# Patient Record
Sex: Female | Born: 1980 | Hispanic: No | Smoking: Current every day smoker
Health system: Southern US, Community
[De-identification: ages and names within clinical notes are randomized; demographics above are authoritative.]

## PROBLEM LIST (undated history)

## (undated) DIAGNOSIS — N200 Calculus of kidney: Secondary | ICD-10-CM

## (undated) HISTORY — PX: LITHOTRIPSY: SUR834

---

## 2007-08-17 ENCOUNTER — Other Ambulatory Visit: Admission: RE | Admit: 2007-08-17 | Discharge: 2007-08-17 | Payer: Self-pay | Admitting: Obstetrics and Gynecology

## 2012-03-30 ENCOUNTER — Emergency Department (HOSPITAL_COMMUNITY)
Admission: EM | Admit: 2012-03-30 | Discharge: 2012-03-30 | Disposition: A | Discharge status: Home / Self Care | Payer: Self-pay | Attending: Emergency Medicine | Admitting: Emergency Medicine

## 2012-03-30 ENCOUNTER — Emergency Department (HOSPITAL_COMMUNITY): Payer: Self-pay

## 2012-03-30 ENCOUNTER — Encounter (HOSPITAL_COMMUNITY): Payer: Self-pay | Admitting: Emergency Medicine

## 2012-03-30 DIAGNOSIS — N201 Calculus of ureter: Secondary | ICD-10-CM | POA: Insufficient documentation

## 2012-03-30 DIAGNOSIS — R319 Hematuria, unspecified: Secondary | ICD-10-CM | POA: Insufficient documentation

## 2012-03-30 DIAGNOSIS — R109 Unspecified abdominal pain: Secondary | ICD-10-CM | POA: Insufficient documentation

## 2012-03-30 HISTORY — DX: Calculus of kidney: N20.0

## 2012-03-30 LAB — URINALYSIS, ROUTINE W REFLEX MICROSCOPIC
Bilirubin Urine: NEGATIVE
Nitrite: NEGATIVE
Protein, ur: NEGATIVE mg/dL
Urobilinogen, UA: 0.2 mg/dL (ref 0.0–1.0)

## 2012-03-30 LAB — URINE MICROSCOPIC-ADD ON

## 2012-03-30 MED ORDER — PROMETHAZINE HCL 25 MG PO TABS: 25.0000 mg | ORAL_TABLET | Freq: Four times a day (QID) | ORAL | Status: DC | PRN

## 2012-03-30 MED ORDER — CEPHALEXIN 500 MG PO CAPS: 500.0000 mg | ORAL_CAPSULE | Freq: Four times a day (QID) | ORAL | Status: AC

## 2012-03-30 MED ORDER — DEXTROSE 5 % IV SOLN
1.0000 g | Freq: Once | INTRAVENOUS | Status: AC
  Administered 2012-03-30: 1 g via INTRAVENOUS
  Filled 2012-03-30: qty 10

## 2012-03-30 MED ORDER — SODIUM CHLORIDE 0.9 % IV BOLUS (SEPSIS)
1000.0000 mL | Freq: Once | INTRAVENOUS | Status: AC
  Administered 2012-03-30: 1000 mL via INTRAVENOUS

## 2012-03-30 MED ORDER — OXYCODONE-ACETAMINOPHEN 5-325 MG PO TABS: 2.0000 | ORAL_TABLET | ORAL | Status: AC | PRN

## 2012-03-30 MED ORDER — ONDANSETRON HCL 4 MG/2ML IJ SOLN
4.0000 mg | Freq: Once | INTRAMUSCULAR | Status: AC
  Administered 2012-03-30: 4 mg via INTRAVENOUS
  Filled 2012-03-30: qty 2

## 2012-03-30 MED ORDER — KETOROLAC TROMETHAMINE 30 MG/ML IJ SOLN
30.0000 mg | Freq: Once | INTRAMUSCULAR | Status: AC
  Administered 2012-03-30: 30 mg via INTRAVENOUS
  Filled 2012-03-30: qty 1

## 2012-03-30 MED ORDER — HYDROMORPHONE HCL PF 1 MG/ML IJ SOLN
1.0000 mg | Freq: Once | INTRAMUSCULAR | Status: AC
  Administered 2012-03-30: 1 mg via INTRAVENOUS
  Filled 2012-03-30: qty 1

## 2012-03-30 NOTE — ED Provider Notes (Signed)
History     CSN: [id number]  Arrival date & time 03/30/12  0123   First MD Initiated Contact with Patient 03/30/12 0130      Chief Complaint  Patient presents with  . Flank Pain    (Consider location/radiation/quality/duration/timing/severity/associated sxs/prior treatment) HPI History provided by the patient. At home tonight around 5-6 PM, had sudden onset of severe left flank pain. No radiation. Sharp in quality. Pain has been constant and persistent since onset. Has associated hematuria and symptoms feel like kidney stone with history of same x1.  Some nausea no vomiting. No fevers or chills. No difficulty urinating. No diarrhea or blood in stools. No known aggravating or alleviating factors. Unable to get comfortable. Past Medical History  Diagnosis Date  . Kidney stones     History reviewed. No pertinent past surgical history.  No family history on file.  History  Substance Use Topics  . Smoking status: Current Everyday Smoker  . Smokeless tobacco: Not on file  . Alcohol Use: Yes    OB History    Grav Para Term Preterm Abortions TAB SAB Ect Mult Living                  Review of Systems  Constitutional: Negative for fever and chills.  HENT: Negative for neck pain and neck stiffness.   Eyes: Negative for pain.  Respiratory: Negative for shortness of breath.   Cardiovascular: Negative for chest pain.  Gastrointestinal: Negative for abdominal pain.  Genitourinary: Positive for hematuria and flank pain. Negative for dysuria.  Musculoskeletal: Negative for back pain.  Skin: Negative for rash.  Neurological: Negative for headaches.  All other systems reviewed and are negative.    Allergies  Review of patient's allergies indicates no known allergies.  Home Medications   Current Outpatient Rx  Name Route Sig Dispense Refill  . IBUPROFEN 200 MG PO TABS Oral Take 200-400 mg by mouth every 6 (six) hours as needed. pain    . ORTHO TRI-CYCLEN LO PO Oral Take 1  tablet by mouth daily.      BP 161/113  Pulse 129  Temp(Src) 98.1 F (36.7 C) (Oral)  Resp 18  SpO2 99%  LMP 03/15/2012  Physical Exam  Constitutional: She is oriented to person, place, and time. She appears well-developed and well-nourished.  HENT:  Head: Normocephalic and atraumatic.  Eyes: Conjunctivae and EOM are normal. Pupils are equal, round, and reactive to light.  Neck: Trachea normal. Neck supple. No thyromegaly present.  Cardiovascular: Normal rate, regular rhythm, S1 normal, S2 normal and normal pulses.     No systolic murmur is present   No diastolic murmur is present  Pulses:      Radial pulses are 2+ on the right side, and 2+ on the left side.  Pulmonary/Chest: Effort normal and breath sounds normal. She has no wheezes. She has no rhonchi. She has no rales. She exhibits no tenderness.  Abdominal: Soft. Normal appearance and bowel sounds are normal. There is no tenderness. There is no CVA tenderness and negative Murphy's sign.       Localizes discomfort to left flank and left lower quadrant region without reproducible tenderness. No ecchymosis or discoloration.   Musculoskeletal:       BLE:s Calves nontender, no cords or erythema, negative Homans sign  Neurological: She is alert and oriented to person, place, and time. She has normal strength. No cranial nerve deficit or sensory deficit. GCS eye subscore is 4. GCS verbal subscore is 5. GCS  motor subscore is 6.  Skin: Skin is warm and dry. No rash noted. She is not diaphoretic.  Psychiatric: Her speech is normal.       Cooperative and appropriate    ED Course  Procedures (including critical care time)  Results for orders placed during the hospital encounter of 03/30/12  URINALYSIS, ROUTINE W REFLEX MICROSCOPIC      Component Value Range   Color, Urine YELLOW  YELLOW    APPearance CLOUDY (*) CLEAR    Specific Gravity, Urine 1.018  1.005 - 1.030    pH 6.0  5.0 - 8.0    Glucose, UA NEGATIVE  NEGATIVE (mg/dL)    Hgb urine dipstick MODERATE (*) NEGATIVE    Bilirubin Urine NEGATIVE  NEGATIVE    Ketones, ur NEGATIVE  NEGATIVE (mg/dL)   Protein, ur NEGATIVE  NEGATIVE (mg/dL)   Urobilinogen, UA 0.2  0.0 - 1.0 (mg/dL)   Nitrite NEGATIVE  NEGATIVE    Leukocytes, UA SMALL (*) NEGATIVE   POCT PREGNANCY, URINE      Component Value Range   Preg Test, Ur NEGATIVE  NEGATIVE   URINE MICROSCOPIC-ADD ON      Component Value Range   Squamous Epithelial / LPF MANY (*) RARE    WBC, UA 3-6  <3 (WBC/hpf)   RBC / HPF 11-20  <3 (RBC/hpf)   Bacteria, UA MANY (*) RARE    Urine-Other MUCOUS PRESENT     Ct Abdomen Pelvis Wo Contrast  03/30/2012  *RADIOLOGY REPORT*  Clinical Data: Left flank pain.  History of bilateral kidney stones.  CT ABDOMEN AND PELVIS WITHOUT CONTRAST  Technique:  Multidetector CT imaging of the abdomen and pelvis was performed following the standard protocol without intravenous contrast.  Comparison: 07/17/2010  Findings: The lung bases are clear.  There is a 3 mm stone in the proximal left ureter at the level of L4 with proximal pyelocaliectasis and ureterectasis.  There is periureteral and pararenal stranding.  Changes are consistent with moderate obstruction.  The distal left ureter is decompressed and no additional ureteral or bladder stones are visualized.  The bladder wall is not thickened.  Punctate stones are demonstrated in the lower pole of the right kidney without evidence of obstruction.  The unenhanced appearance of the liver, spleen, contracted gallbladder, pancreas, adrenal glands, abdominal aorta, retroperitoneal lymph nodes, stomach, and small bowel are unremarkable.  Stool filled colon without distension.  No free air or free fluid in the abdomen.  Pelvis:  Uterus and adnexal structures are not enlarged.  No free or loculated pelvic fluid collections.  The appendix is normal.  No inflammatory changes in the sigmoid colon.  Normal alignment of the lumbar vertebrae.    IMPRESSION: 3 mm stone  in the proximal left ureter with moderate proximal obstruction.  Punctate nonobstructing stones in the lower pole of the right kidney.  Original Report Authenticated By: Marlon Pel, M.D.    Pain control IV narcotics. UA and imaging obtained and reviewed as above.   MDM   Left flank pain and CT scan demonstrating 3 mm ureteral stone. UA reviewed with possible contamination squamous cells and bacteria. Urine culture sent and pending. IV antibiotics initiated. Urology referral with prescription for pain medications and Keflex provided. Reliable historian verbalizes understanding kidney stone, and with possible UTI, precautions. Stable for discharge home at this time with close outpatient follow-up.         Sunnie Nielsen, MD 03/30/12 [phone]

## 2012-03-30 NOTE — ED Notes (Signed)
PT. REPORTS LEFT FLANK PAIN WITH VOMITTING ONSET 5 PM YESTERDAY , STATES HISTORY OF KIDNEY STONES .

## 2012-03-30 NOTE — Discharge Instructions (Signed)

## 2012-03-30 NOTE — ED Notes (Signed)
abx infusing, pt calling family for ride home, alert, NAD, calm, interactive, "feels better". Preparing for d/c.

## 2012-03-31 LAB — URINE CULTURE
Colony Count: NO GROWTH
Culture  Setup Time: 201304250509

## 2012-05-04 ENCOUNTER — Emergency Department (HOSPITAL_COMMUNITY)
Admission: EM | Admit: 2012-05-04 | Discharge: 2012-05-04 | Disposition: A | Discharge status: Home / Self Care | Payer: Self-pay | Attending: Emergency Medicine | Admitting: Emergency Medicine

## 2012-05-04 ENCOUNTER — Emergency Department (HOSPITAL_COMMUNITY): Payer: Self-pay

## 2012-05-04 ENCOUNTER — Encounter (HOSPITAL_COMMUNITY): Payer: Self-pay | Admitting: *Deleted

## 2012-05-04 DIAGNOSIS — N390 Urinary tract infection, site not specified: Secondary | ICD-10-CM | POA: Insufficient documentation

## 2012-05-04 DIAGNOSIS — N200 Calculus of kidney: Secondary | ICD-10-CM | POA: Insufficient documentation

## 2012-05-04 DIAGNOSIS — F17200 Nicotine dependence, unspecified, uncomplicated: Secondary | ICD-10-CM | POA: Insufficient documentation

## 2012-05-04 DIAGNOSIS — R109 Unspecified abdominal pain: Secondary | ICD-10-CM | POA: Insufficient documentation

## 2012-05-04 LAB — URINE MICROSCOPIC-ADD ON

## 2012-05-04 LAB — URINALYSIS, ROUTINE W REFLEX MICROSCOPIC
Bilirubin Urine: NEGATIVE
Glucose, UA: NEGATIVE mg/dL
Ketones, ur: NEGATIVE mg/dL
pH: 8 (ref 5.0–8.0)

## 2012-05-04 MED ORDER — SODIUM CHLORIDE 0.9 % IV SOLN
Freq: Once | INTRAVENOUS | Status: AC
  Administered 2012-05-04: 16:00:00 via INTRAVENOUS

## 2012-05-04 MED ORDER — CEPHALEXIN 250 MG PO CAPS
500.0000 mg | ORAL_CAPSULE | Freq: Once | ORAL | Status: AC
  Administered 2012-05-04: 500 mg via ORAL
  Filled 2012-05-04: qty 2

## 2012-05-04 MED ORDER — ONDANSETRON HCL 4 MG/2ML IJ SOLN
4.0000 mg | Freq: Once | INTRAMUSCULAR | Status: AC
  Administered 2012-05-04: 4 mg via INTRAVENOUS
  Filled 2012-05-04 (×2): qty 2

## 2012-05-04 MED ORDER — HYDROMORPHONE HCL PF 1 MG/ML IJ SOLN
1.0000 mg | Freq: Once | INTRAMUSCULAR | Status: AC
  Administered 2012-05-04: 1 mg via INTRAVENOUS
  Filled 2012-05-04: qty 1

## 2012-05-04 MED ORDER — KETOROLAC TROMETHAMINE 30 MG/ML IJ SOLN
30.0000 mg | Freq: Once | INTRAMUSCULAR | Status: AC
  Administered 2012-05-04: 30 mg via INTRAVENOUS
  Filled 2012-05-04 (×2): qty 1

## 2012-05-04 MED ORDER — OXYCODONE-ACETAMINOPHEN 5-325 MG PO TABS: 1.0000 | ORAL_TABLET | ORAL | Status: AC | PRN

## 2012-05-04 MED ORDER — CEPHALEXIN 500 MG PO CAPS: 500.0000 mg | ORAL_CAPSULE | Freq: Three times a day (TID) | ORAL | Status: AC

## 2012-05-04 MED ORDER — HYDROMORPHONE HCL PF 1 MG/ML IJ SOLN
1.0000 mg | Freq: Once | INTRAMUSCULAR | Status: AC
  Administered 2012-05-04: 1 mg via INTRAVENOUS
  Filled 2012-05-04 (×2): qty 1

## 2012-05-04 NOTE — Discharge Instructions (Signed)
Take your Phenergan as needed to control nausea. Return to the emergency department if pain is not being adequately controlled, you vomiting, or if you start running a high fever.  Urinary Tract Infection Infections of the urinary tract can start in several places. A bladder infection (cystitis), a kidney infection (pyelonephritis), and a prostate infection (prostatitis) are different types of urinary tract infections (UTIs). They usually get better if treated with medicines (antibiotics) that kill germs. Take all the medicine until it is gone. You or your child may feel better in a few days, but TAKE ALL MEDICINE or the infection may not respond and may become more difficult to treat. HOME CARE INSTRUCTIONS   Drink enough water and fluids to keep the urine clear or pale yellow. Cranberry juice is especially recommended, in addition to large amounts of water.   Avoid caffeine, tea, and carbonated beverages. They tend to irritate the bladder.   Alcohol may irritate the prostate.   Only take over-the-counter or prescription medicines for pain, discomfort, or fever as directed by your caregiver.  To prevent further infections:  Empty the bladder often. Avoid holding urine for long periods of time.   After a bowel movement, women should cleanse from front to back. Use each tissue only once.   Empty the bladder before and after sexual intercourse.  FINDING OUT THE RESULTS OF YOUR TEST Not all test results are available during your visit. If your or your child's test results are not back during the visit, make an appointment with your caregiver to find out the results. Do not assume everything is normal if you have not heard from your caregiver or the medical facility. It is important for you to follow up on all test results. SEEK MEDICAL CARE IF:   There is back pain.   Your baby is older than 3 months with a rectal temperature of 100.5 F (38.1 C) or higher for more than 1 day.   Your or  your child's problems (symptoms) are no better in 3 days. Return sooner if you or your child is getting worse.  SEEK IMMEDIATE MEDICAL CARE IF:   There is severe back pain or lower abdominal pain.   You or your child develops chills.   You have a fever.   Your baby is older than 3 months with a rectal temperature of 102 F (38.9 C) or higher.   Your baby is 33 months old or younger with a rectal temperature of 100.4 F (38 C) or higher.   There is nausea or vomiting.   There is continued burning or discomfort with urination.  MAKE SURE YOU:   Understand these instructions.   Will watch your condition.   Will get help right away if you are not doing well or get worse.  Document Released: 09/01/2005 Document Revised: 11/11/2011 Document Reviewed: 04/06/2007 [facility] Patient Information 2012 [place], [place].  Cephalexin tablets or capsules What is this medicine? CEPHALEXIN (sef a LEX in) is a cephalosporin antibiotic. It is used to treat certain kinds of bacterial infections It will not work for colds, flu, or other viral infections. This medicine may be used for other purposes; ask your health care provider or pharmacist if you have questions. What should I tell my health care provider before I take this medicine? They need to know if you have any of these conditions: -kidney disease -stomach or intestine problems, especially colitis -an unusual or allergic reaction to cephalexin, other cephalosporins, penicillins, other antibiotics, medicines, foods, dyes  or preservatives -pregnant or trying to get pregnant -breast-feeding How should I use this medicine? Take this medicine by mouth with a full glass of water. Follow the directions on the prescription label. This medicine can be taken with or without food. Take your medicine at regular intervals. Do not take your medicine more often than directed. Take all of your medicine as directed even if you think you are better. Do not skip  doses or stop your medicine early. Talk to your pediatrician regarding the use of this medicine in children. While this drug may be prescribed for selected conditions, precautions do apply. Overdosage: If you think you have taken too much of this medicine contact a poison control center or emergency room at once. NOTE: This medicine is only for you. Do not share this medicine with others. What if I miss a dose? If you miss a dose, take it as soon as you can. If it is almost time for your next dose, take only that dose. Do not take double or extra doses. There should be at least 4 to 6 hours between doses. What may interact with this medicine? -probenecid -some other antibiotics This list may not describe all possible interactions. Give your health care provider a list of all the medicines, herbs, non-prescription drugs, or dietary supplements you use. Also tell them if you smoke, drink alcohol, or use illegal drugs. Some items may interact with your medicine. What should I watch for while using this medicine? Tell your doctor or health care professional if your symptoms do not begin to improve in a few days. Do not treat diarrhea with over the counter products. Contact your doctor if you have diarrhea that lasts more than 2 days or if it is severe and watery. If you have diabetes, you may get a false-positive result for sugar in your urine. Check with your doctor or health care professional. What side effects may I notice from receiving this medicine? Side effects that you should report to your doctor or health care professional as soon as possible: -allergic reactions like skin rash, itching or hives, swelling of the face, lips, or tongue -breathing problems -pain or trouble passing urine -redness, blistering, peeling or loosening of the skin, including inside the mouth -severe or watery diarrhea -unusually weak or tired -yellowing of the eyes, skin Side effects that usually do not require  medical attention (report to your doctor or health care professional if they continue or are bothersome): -gas or heartburn -genital or anal irritation -headache -joint or muscle pain -nausea, vomiting This list may not describe all possible side effects. Call your doctor for medical advice about side effects. You may report side effects to FDA at 1-800-FDA-1088. Where should I keep my medicine? Keep out of the reach of children. Store at room temperature between 59 and 86 degrees F (15 and 30 degrees C). Throw away any unused medicine after the expiration date. NOTE: This sheet is a summary. It may not cover all possible information. If you have questions about this medicine, talk to your doctor, pharmacist, or health care provider.  2012, [employer]vier/Gold Standard. (02/26/2008 5:09:13 PM)  Acetaminophen; Oxycodone tablets What is this medicine? ACETAMINOPHEN; OXYCODONE (a set a MEE noe fen; ox i KOE done) is a pain reliever. It is used to treat mild to moderate pain. This medicine may be used for other purposes; ask your health care provider or pharmacist if you have questions. What should I tell my health care provider before I  take this medicine? They need to know if you have any of these conditions: -brain tumor -Crohn's disease, inflammatory bowel disease, or ulcerative colitis -drink more than 3 alcohol containing drinks per day -drug abuse or addiction -head injury -heart or circulation problems -kidney disease or problems going to the bathroom -liver disease -lung disease, asthma, or breathing problems -an unusual or allergic reaction to acetaminophen, oxycodone, other opioid analgesics, other medicines, foods, dyes, or preservatives -pregnant or trying to get pregnant -breast-feeding How should I use this medicine? Take this medicine by mouth with a full glass of water. Follow the directions on the prescription label. Take your medicine at regular intervals. Do not take your  medicine more often than directed. Talk to your pediatrician regarding the use of this medicine in children. Special care may be needed. Patients over 29 years old may have a stronger reaction and need a smaller dose. Overdosage: If you think you have taken too much of this medicine contact a poison control center or emergency room at once. NOTE: This medicine is only for you. Do not share this medicine with others. What if I miss a dose? If you miss a dose, take it as soon as you can. If it is almost time for your next dose, take only that dose. Do not take double or extra doses. What may interact with this medicine? -alcohol or medicines that contain alcohol -antihistamines -barbiturates like amobarbital, butalbital, butabarbital, methohexital, pentobarbital, phenobarbital, thiopental, and secobarbital -benztropine -drugs for bladder problems like solifenacin, trospium, oxybutynin, tolterodine, hyoscyamine, and methscopolamine -drugs for breathing problems like ipratropium and tiotropium -drugs for certain stomach or intestine problems like propantheline, homatropine methylbromide, glycopyrrolate, atropine, belladonna, and dicyclomine -general anesthetics like etomidate, ketamine, nitrous oxide, propofol, desflurane, enflurane, halothane, isoflurane, and sevoflurane -medicines for depression, anxiety, or psychotic disturbances -medicines for pain like codeine, morphine, pentazocine, buprenorphine, butorphanol, nalbuphine, tramadol, and propoxyphene -medicines for sleep -muscle relaxants -naltrexone -phenothiazines like perphenazine, thioridazine, chlorpromazine, mesoridazine, fluphenazine, prochlorperazine, promazine, and trifluoperazine -scopolamine -trihexyphenidyl This list may not describe all possible interactions. Give your health care provider a list of all the medicines, herbs, non-prescription drugs, or dietary supplements you use. Also tell them if you smoke, drink alcohol, or use  illegal drugs. Some items may interact with your medicine. What should I watch for while using this medicine? Tell your doctor or health care professional if your pain does not go away, if it gets worse, or if you have new or a different type of pain. You may develop tolerance to the medicine. Tolerance means that you will need a higher dose of the medication for pain relief. Tolerance is normal and is expected if you take this medicine for a long time. Do not suddenly stop taking your medicine because you may develop a severe reaction. Your body becomes used to the medicine. This does NOT mean you are addicted. Addiction is a behavior related to getting and using a drug for a nonmedical reason. If you have pain, you have a medical reason to take pain medicine. Your doctor will tell you how much medicine to take. If your doctor wants you to stop the medicine, the dose will be slowly lowered over time to avoid any side effects. You may get drowsy or dizzy. Do not drive, use machinery, or do anything that needs mental alertness until you know how this medicine affects you. Do not stand or sit up quickly, especially if you are an older patient. This reduces the risk of dizzy or  fainting spells. Alcohol may interfere with the effect of this medicine. Avoid alcoholic drinks. The medicine will cause constipation. Try to have a bowel movement at least every 2 to 3 days. If you do not have a bowel movement for 3 days, call your doctor or health care professional. Do not take Tylenol (acetaminophen) or medicines that have acetaminophen with this medicine. Too much acetaminophen can be very dangerous. Many nonprescription medicines contain acetaminophen. Always read the labels carefully to avoid taking more acetaminophen. What side effects may I notice from receiving this medicine? Side effects that you should report to your doctor or health care professional as soon as possible: -allergic reactions like skin rash,  itching or hives, swelling of the face, lips, or tongue -breathing difficulties, wheezing -confusion -light headedness or fainting spells -severe stomach pain -yellowing of the skin or the whites of the eyes Side effects that usually do not require medical attention (report to your doctor or health care professional if they continue or are bothersome): -dizziness -drowsiness -nausea -vomiting This list may not describe all possible side effects. Call your doctor for medical advice about side effects. You may report side effects to FDA at 1-800-FDA-1088. Where should I keep my medicine? Keep out of the reach of children. This medicine can be abused. Keep your medicine in a safe place to protect it from theft. Do not share this medicine with anyone. Selling or giving away this medicine is dangerous and against the law. Store at room temperature between 20 and 25 degrees C (68 and 77 degrees F). Keep container tightly closed. Protect from light. Flush any unused medicines down the toilet. Do not use the medicine after the expiration date. NOTE: This sheet is a summary. It may not cover all possible information. If you have questions about this medicine, talk to your doctor, pharmacist, or health care provider.  2012, [employer]vier/Gold Standard. (10/21/2008 10:01:21 AM)

## 2012-05-04 NOTE — ED Notes (Signed)
Reports having several kidney stones, having pain to left flank since 1100, denies urinary problems.

## 2012-05-04 NOTE — ED Provider Notes (Signed)
History  This chart was scribed for Dione Booze, MD by Bennett Scrape. This patient was seen in room STRE8/STRE8 and the patient's care was started at 3:20PM.  CSN: [id number]  Arrival date & time 05/04/12  1421   First MD Initiated Contact with Patient 05/04/12 1520      Chief Complaint  Patient presents with  . Flank Pain    The history is provided by the patient. No language interpreter was used.    Gabriella Martinez is a 31 y.o. female with a h/o kidney stones who presents to the Emergency Department complaining of gradual onset, gradually worsening, constant, non-radiating left flank pain. She was seen here last month for the same symptoms and a stone was found on CT scan on the left side. She states that she was admitted at [facility] overnight a few days later and given Cipro to help "pass the stone". She is unsure of whether she passed it or not. She rates her pain a 10 out of 10 currently. She reports taking one Vicodin with no improvement in pain. The pain is worse with touch. She denies having trouble urinating, back pain, nausea and fever as associated symptoms. She is a current everyday smoker and social alcohol user.   Past Medical History  Diagnosis Date  . Kidney stones     History reviewed. No pertinent past surgical history.  History reviewed. No pertinent family history.  History  Substance Use Topics  . Smoking status: Current Everyday Smoker-1.0 pack/day  . Smokeless tobacco: Not on file  . Alcohol Use: Yes     Review of Systems  Constitutional: Negative for fever and chills.  Respiratory: Negative for shortness of breath.   Gastrointestinal: Negative for nausea and vomiting.  Genitourinary: Positive for flank pain. Negative for hematuria.  Neurological: Negative for weakness.    Allergies  Review of patient's allergies indicates no known allergies.  Home Medications   Current Outpatient Rx  Name Route Sig Dispense Refill  . IBUPROFEN 200  MG PO TABS Oral Take 200-400 mg by mouth every 6 (six) hours as needed. pain    . ORTHO TRI-CYCLEN LO PO Oral Take 1 tablet by mouth daily.    Marland Kitchen PROMETHAZINE HCL 25 MG PO TABS Oral Take 1 tablet (25 mg total) by mouth every 6 (six) hours as needed for nausea. 30 tablet 0    Triage vitals: BP 142/103  Pulse 91  Temp(Src) 98.6 F (37 C) (Oral)  Resp 18  SpO2 99%  LMP 04/13/2012  Physical Exam  Nursing note and vitals reviewed. Constitutional: She is oriented to person, place, and time. She appears well-developed and well-nourished. No distress.  HENT:  Head: Normocephalic and atraumatic.  Eyes: EOM are normal.  Neck: Neck supple. No tracheal deviation present.  Cardiovascular: Normal rate.   Pulmonary/Chest: Effort normal. No respiratory distress.  Abdominal: Bowel sounds are normal. There is tenderness (mild bilateral CVA tenderness). There is no rebound and no guarding.  Musculoskeletal: Normal range of motion.  Neurological: She is alert and oriented to person, place, and time.  Skin: Skin is warm and dry.  Psychiatric: She has a normal mood and affect. Her behavior is normal.    ED Course  Procedures (including critical care time)  DIAGNOSTIC STUDIES: Oxygen Saturation is 99% on room air, normal by my interpretation.    COORDINATION OF CARE: 3:26PM-Discussed treatment plan which includes [place], pain medication and IV antinausea medications with pt and pt agreed to plan.  4:33PM-Discussed urinalysis  with pt and discharge plan of keflex, pain medications and antinausea medications. Pt states that she still has some phenergan from her last visit and she stated that she would rather use that then get another prescription.  Results for orders placed during the hospital encounter of 05/04/12  URINALYSIS, ROUTINE W REFLEX MICROSCOPIC      Component Value Range   Color, Urine YELLOW  YELLOW    APPearance TURBID (*) CLEAR    Specific Gravity, Urine 1.021  1.005 - 1.030    pH 8.0   5.0 - 8.0    Glucose, UA NEGATIVE  NEGATIVE (mg/dL)   Hgb urine dipstick MODERATE (*) NEGATIVE    Bilirubin Urine NEGATIVE  NEGATIVE    Ketones, ur NEGATIVE  NEGATIVE (mg/dL)   Protein, ur NEGATIVE  NEGATIVE (mg/dL)   Urobilinogen, UA 0.2  0.0 - 1.0 (mg/dL)   Nitrite NEGATIVE  NEGATIVE    Leukocytes, UA SMALL (*) NEGATIVE   POCT PREGNANCY, URINE      Component Value Range   Preg Test, Ur NEGATIVE  NEGATIVE   URINE MICROSCOPIC-ADD ON      Component Value Range   Squamous Epithelial / LPF MANY (*) RARE    WBC, UA 3-6  <3 (WBC/hpf)   RBC / HPF 21-50  <3 (RBC/hpf)   Bacteria, UA MANY (*) RARE    Urine-Other MUCOUS PRESENT     US Renal  05/04/2012  *RADIOLOGY REPORT*  Clinical Data: Left flank pain.  3 mm proximal left ureteral calculus and nonobstructing lower pole right renal calculi on an abdomen and pelvis CT dated 03/30/2012.  RENAL/URINARY TRACT ULTRASOUND COMPLETE  Comparison:  Abdomen pelvis CT dated 03/30/2012.  Findings:  Right Kidney:  Normal in size, shape and echotexture, measuring 10.2 cm in length.  The previously seen small calculi are not visible.  No hydronephrosis.  Left Kidney:  Normal in size, shape and echotexture, measuring 10.2 cm in length.  No hydronephrosis.  Bladder:  Normal.  IMPRESSION: Normal examination.  The previously seen left hydronephrosis is no longer demonstrated and the previously seen small right renal calculi are not sonographically visible.  Original Report Authenticated By: Darrol Angel, M.D.      1. Left flank pain   2. Urinary tract infection       MDM  Recurrent left flank pain in patient with known kidney stones. Prior ED visit and CT scan were reviewed and she did have a ureteral calculus about 3 mm in the proximal left ureter which is not visible on the topogram. 2 avoid excess radiation, a renal ultrasound is obtained. Renal ultrasound shows no evidence of hydronephrosis as I do not believe her pain is 2 to her kidney stone. Urinalysis  is a dirty specimen but is noted to have many bacteria. She is sent home with a prescription for cephalexin and Percocet for pain. Urine has been sent for culture.      I personally performed the services described in this documentation, which was scribed in my presence. The recorded information has been reviewed and considered.      Dione Booze, MD 05/05/12 [id number]

## 2012-05-08 LAB — URINE CULTURE: Colony Count: >=100000

## 2012-05-09 NOTE — ED Notes (Signed)
+   Urine Patient treated with Keflex-sensitive to same-chart appended per protocol MD. 

## 2012-05-18 ENCOUNTER — Encounter (HOSPITAL_COMMUNITY): Payer: Self-pay | Admitting: Anesthesiology

## 2012-05-18 ENCOUNTER — Observation Stay (HOSPITAL_COMMUNITY): Payer: Self-pay | Admitting: Anesthesiology

## 2012-05-18 ENCOUNTER — Encounter (HOSPITAL_COMMUNITY)
Admission: EM | Disposition: A | Discharge status: Home / Self Care | Payer: Self-pay | Source: Home / Self Care | Attending: Emergency Medicine

## 2012-05-18 ENCOUNTER — Emergency Department (HOSPITAL_COMMUNITY): Payer: Self-pay

## 2012-05-18 ENCOUNTER — Ambulatory Visit (HOSPITAL_COMMUNITY)
Admission: EM | Admit: 2012-05-18 | Discharge: 2012-05-19 | Disposition: A | Discharge status: Home / Self Care | Payer: Self-pay | Attending: Emergency Medicine | Admitting: Emergency Medicine

## 2012-05-18 ENCOUNTER — Encounter (HOSPITAL_COMMUNITY): Payer: Self-pay

## 2012-05-18 DIAGNOSIS — N201 Calculus of ureter: Secondary | ICD-10-CM | POA: Insufficient documentation

## 2012-05-18 DIAGNOSIS — Z79899 Other long term (current) drug therapy: Secondary | ICD-10-CM | POA: Insufficient documentation

## 2012-05-18 DIAGNOSIS — N390 Urinary tract infection, site not specified: Secondary | ICD-10-CM | POA: Insufficient documentation

## 2012-05-18 HISTORY — PX: CYSTOSCOPY W/ URETERAL STENT PLACEMENT: SHX1429

## 2012-05-18 LAB — DIFFERENTIAL
Basophils Relative: 0 % (ref 0–1)
Eosinophils Absolute: 0 10*3/uL (ref 0.0–0.7)
Monocytes Absolute: 0.8 10*3/uL (ref 0.1–1.0)
Neutro Abs: 12.3 10*3/uL — ABNORMAL HIGH (ref 1.7–7.7)

## 2012-05-18 LAB — URINALYSIS, ROUTINE W REFLEX MICROSCOPIC
Nitrite: NEGATIVE
Urobilinogen, UA: 1 mg/dL (ref 0.0–1.0)
pH: 6 (ref 5.0–8.0)

## 2012-05-18 LAB — COMPREHENSIVE METABOLIC PANEL
Alkaline Phosphatase: 71 U/L (ref 39–117)
BUN: 11 mg/dL (ref 6–23)
Calcium: 9.2 mg/dL (ref 8.4–10.5)
GFR calc Af Amer: >90 mL/min (ref >90)
GFR calc non Af Amer: 85 mL/min — ABNORMAL LOW (ref >90)
Glucose, Bld: 130 mg/dL — ABNORMAL HIGH (ref 70–99)
Potassium: 3.4 mEq/L — ABNORMAL LOW (ref 3.5–5.1)
Total Protein: 6.8 g/dL (ref 6.0–8.3)

## 2012-05-18 LAB — CBC
HCT: 34.2 % — ABNORMAL LOW (ref 36.0–46.0)
Hemoglobin: 11.4 g/dL — ABNORMAL LOW (ref 12.0–15.0)
MCH: 30.1 pg (ref 26.0–34.0)
MCHC: 33.3 g/dL (ref 30.0–36.0)

## 2012-05-18 LAB — URINE MICROSCOPIC-ADD ON

## 2012-05-18 SURGERY — CYSTOSCOPY, WITH RETROGRADE PYELOGRAM AND URETERAL STENT INSERTION
Anesthesia: General | Laterality: Left | Wound class: Clean Contaminated

## 2012-05-18 MED ORDER — ONDANSETRON HCL 4 MG/2ML IJ SOLN
INTRAMUSCULAR | Status: DC | PRN
  Administered 2012-05-18: 4 mg via INTRAVENOUS

## 2012-05-18 MED ORDER — MORPHINE SULFATE 4 MG/ML IJ SOLN
4.0000 mg | Freq: Once | INTRAMUSCULAR | Status: AC
  Administered 2012-05-18: 4 mg via INTRAVENOUS
  Filled 2012-05-18: qty 1

## 2012-05-18 MED ORDER — IBUPROFEN 200 MG PO TABS
200.0000 mg | ORAL_TABLET | Freq: Four times a day (QID) | ORAL | Status: DC | PRN
  Filled 2012-05-18: qty 2

## 2012-05-18 MED ORDER — METOCLOPRAMIDE HCL 5 MG/ML IJ SOLN
INTRAMUSCULAR | Status: DC | PRN
  Administered 2012-05-18: 10 mg via INTRAVENOUS

## 2012-05-18 MED ORDER — SODIUM CHLORIDE 0.9 % IV BOLUS (SEPSIS)
1000.0000 mL | Freq: Once | INTRAVENOUS | Status: AC
  Administered 2012-05-18: 1000 mL via INTRAVENOUS

## 2012-05-18 MED ORDER — MIDAZOLAM HCL 5 MG/5ML IJ SOLN
INTRAMUSCULAR | Status: DC | PRN
  Administered 2012-05-18 (×2): 1 mg via INTRAVENOUS

## 2012-05-18 MED ORDER — PROMETHAZINE HCL 25 MG/ML IJ SOLN: 6.2500 mg | INTRAMUSCULAR | Status: DC | PRN

## 2012-05-18 MED ORDER — LACTATED RINGERS IV SOLN
INTRAVENOUS | Status: DC | PRN
  Administered 2012-05-18: 15:00:00 via INTRAVENOUS

## 2012-05-18 MED ORDER — BELLADONNA ALKALOIDS-OPIUM 16.2-60 MG RE SUPP
RECTAL | Status: DC | PRN
  Administered 2012-05-18: 1 via RECTAL

## 2012-05-18 MED ORDER — DEXTROSE 5 % IV SOLN
1.0000 g | Freq: Once | INTRAVENOUS | Status: AC
  Administered 2012-05-18: 1 g via INTRAVENOUS
  Filled 2012-05-18: qty 10

## 2012-05-18 MED ORDER — IOHEXOL 300 MG/ML  SOLN
INTRAMUSCULAR | Status: DC | PRN
  Administered 2012-05-18: 50 mL

## 2012-05-18 MED ORDER — ONDANSETRON HCL 4 MG/2ML IJ SOLN: 4.0000 mg | INTRAMUSCULAR | Status: DC | PRN

## 2012-05-18 MED ORDER — ACETAMINOPHEN 10 MG/ML IV SOLN
INTRAVENOUS | Status: AC
  Filled 2012-05-18: qty 100

## 2012-05-18 MED ORDER — PROPOFOL 10 MG/ML IV EMUL
INTRAVENOUS | Status: DC | PRN
  Administered 2012-05-18: 175 mg via INTRAVENOUS

## 2012-05-18 MED ORDER — HYDROCODONE-ACETAMINOPHEN 5-325 MG PO TABS
1.0000 | ORAL_TABLET | ORAL | Status: DC | PRN
  Administered 2012-05-19: 2 via ORAL
  Filled 2012-05-18: qty 2

## 2012-05-18 MED ORDER — EPHEDRINE SULFATE 50 MG/ML IJ SOLN
INTRAMUSCULAR | Status: DC | PRN
  Administered 2012-05-18 (×2): 5 mg via INTRAVENOUS

## 2012-05-18 MED ORDER — LIDOCAINE HCL 2 % EX GEL
CUTANEOUS | Status: DC | PRN
  Administered 2012-05-18: 1

## 2012-05-18 MED ORDER — BELLADONNA ALKALOIDS-OPIUM 16.2-60 MG RE SUPP
RECTAL | Status: AC
  Filled 2012-05-18: qty 1

## 2012-05-18 MED ORDER — HYDROMORPHONE HCL PF 1 MG/ML IJ SOLN
0.5000 mg | INTRAMUSCULAR | Status: DC | PRN
  Administered 2012-05-18 – 2012-05-19 (×2): 1 mg via INTRAVENOUS
  Filled 2012-05-18 (×2): qty 1

## 2012-05-18 MED ORDER — STERILE WATER FOR IRRIGATION IR SOLN
Status: DC | PRN
  Administered 2012-05-18: 3000 mL

## 2012-05-18 MED ORDER — NICOTINE 21 MG/24HR TD PT24
21.0000 mg | MEDICATED_PATCH | Freq: Every day | TRANSDERMAL | Status: DC
  Administered 2012-05-18: 21 mg via TRANSDERMAL
  Filled 2012-05-18 (×2): qty 1

## 2012-05-18 MED ORDER — LIDOCAINE HCL (CARDIAC) 20 MG/ML IV SOLN
INTRAVENOUS | Status: DC | PRN
  Administered 2012-05-18: 75 mg via INTRAVENOUS

## 2012-05-18 MED ORDER — ONDANSETRON HCL 4 MG/2ML IJ SOLN
4.0000 mg | Freq: Once | INTRAMUSCULAR | Status: AC
  Administered 2012-05-18: 4 mg via INTRAVENOUS
  Filled 2012-05-18: qty 2

## 2012-05-18 MED ORDER — KCL IN DEXTROSE-NACL 20-5-0.45 MEQ/L-%-% IV SOLN
INTRAVENOUS | Status: DC
  Administered 2012-05-18 – 2012-05-19 (×2): via INTRAVENOUS
  Filled 2012-05-18 (×4): qty 1000

## 2012-05-18 MED ORDER — ACETAMINOPHEN 10 MG/ML IV SOLN
INTRAVENOUS | Status: DC | PRN
  Administered 2012-05-18: 1000 mg via INTRAVENOUS

## 2012-05-18 MED ORDER — LIDOCAINE HCL 2 % EX GEL
CUTANEOUS | Status: AC
  Filled 2012-05-18: qty 10

## 2012-05-18 MED ORDER — DEXAMETHASONE SODIUM PHOSPHATE 4 MG/ML IJ SOLN
INTRAMUSCULAR | Status: DC | PRN
  Administered 2012-05-18: 10 mg via INTRAVENOUS

## 2012-05-18 MED ORDER — OXYBUTYNIN CHLORIDE 5 MG PO TABS
5.0000 mg | ORAL_TABLET | Freq: Three times a day (TID) | ORAL | Status: DC | PRN
  Filled 2012-05-18: qty 1

## 2012-05-18 MED ORDER — FENTANYL CITRATE 0.05 MG/ML IJ SOLN: 25.0000 ug | INTRAMUSCULAR | Status: DC | PRN

## 2012-05-18 MED ORDER — SODIUM CHLORIDE 0.9 % IV SOLN
INTRAVENOUS | Status: DC | PRN
  Administered 2012-05-18: 16:00:00 via INTRAVENOUS

## 2012-05-18 MED ORDER — DEXTROSE 5 % IV SOLN
1.0000 g | INTRAVENOUS | Status: DC
  Filled 2012-05-18: qty 10

## 2012-05-18 MED ORDER — FENTANYL CITRATE 0.05 MG/ML IJ SOLN
INTRAMUSCULAR | Status: DC | PRN
  Administered 2012-05-18 (×3): 50 ug via INTRAVENOUS
  Administered 2012-05-18 (×2): 25 ug via INTRAVENOUS
  Administered 2012-05-18: 50 ug via INTRAVENOUS

## 2012-05-18 SURGICAL SUPPLY — 18 items
ADAPTER CATH URET PLST 4-6FR (CATHETERS) ×2 IMPLANT
BAG URO CATCHER STRL LF (DRAPE) ×2 IMPLANT
CATH INTERMIT  6FR 70CM (CATHETERS) ×2 IMPLANT
CATH URET 5FR 28IN CONE TIP (BALLOONS)
CATH URET 5FR 70CM CONE TIP (BALLOONS) IMPLANT
CATH URET WHISTLE 5FR 28IN (CATHETERS) IMPLANT
CATH URET WHISTLE 6FR (CATHETERS) IMPLANT
CLOTH BEACON ORANGE TIMEOUT ST (SAFETY) ×2 IMPLANT
GLOVE BIOGEL M STRL SZ7.5 (GLOVE) ×2 IMPLANT
GOWN PREVENTION PLUS XLARGE (GOWN DISPOSABLE) ×2 IMPLANT
GOWN STRL NON-REIN LRG LVL3 (GOWN DISPOSABLE) ×2 IMPLANT
GOWN STRL REIN XL XLG (GOWN DISPOSABLE) ×2 IMPLANT
GUIDEWIRE STR DUAL SENSOR (WIRE) ×2 IMPLANT
MANIFOLD NEPTUNE II (INSTRUMENTS) ×2 IMPLANT
PACK CYSTO (CUSTOM PROCEDURE TRAY) ×2 IMPLANT
STENT CONTOUR 6FRX24X.038 (STENTS) ×2 IMPLANT
TUBING CONNECTING 10 (TUBING) ×2 IMPLANT
WIRE COONS/BENSON .038X145CM (WIRE) ×2 IMPLANT

## 2012-05-18 NOTE — Anesthesia Postprocedure Evaluation (Signed)
  Anesthesia Post-op Note  Patient: Gabriella Martinez  Procedure(s) Performed: Procedure(s) (LRB): CYSTOSCOPY WITH RETROGRADE PYELOGRAM/URETERAL STENT PLACEMENT (Left)  Patient Location: PACU  Anesthesia Type: General  Level of Consciousness: awake and alert   Airway and Oxygen Therapy: Patient Spontanous Breathing  Post-op Pain: mild  Post-op Assessment: Post-op Vital signs reviewed, Patient's Cardiovascular Status Stable, Respiratory Function Stable, Patent Airway and No signs of Nausea or vomiting  Post-op Vital Signs: stable  Complications: No apparent anesthesia complications

## 2012-05-18 NOTE — Anesthesia Preprocedure Evaluation (Addendum)
Anesthesia Evaluation  Patient identified by MRN, date of birth, ID band Patient awake    Reviewed: Allergy & Precautions, H&P , NPO status , Patient's Chart, lab work & pertinent test results  Airway Mallampati: II TM Distance: >3 FB Neck ROM: Full    Dental No notable dental hx.    Pulmonary Current Smoker,  breath sounds clear to auscultation  Pulmonary exam normal       Cardiovascular negative cardio ROS  Rhythm:Regular Rate:Normal     Neuro/Psych negative neurological ROS  negative psych ROS   GI/Hepatic negative GI ROS, Neg liver ROS,   Endo/Other  negative endocrine ROS  Renal/GU negative Renal ROS  negative genitourinary   Musculoskeletal negative musculoskeletal ROS (+)   Abdominal   Peds negative pediatric ROS (+)  Hematology negative hematology ROS (+)   Anesthesia Other Findings   Reproductive/Obstetrics negative OB ROS                          Anesthesia Physical Anesthesia Plan  ASA: II  Anesthesia Plan: General   Post-op Pain Management:    Induction: Intravenous  Airway Management Planned: LMA  Additional Equipment:   Intra-op Plan:   Post-operative Plan: Extubation in OR  Informed Consent: I have reviewed the patients History and Physical, chart, labs and discussed the procedure including the risks, benefits and alternatives for the proposed anesthesia with the patient or authorized representative who has indicated his/her understanding and acceptance.   Dental advisory given  Plan Discussed with: CRNA  Anesthesia Plan Comments: (Proseal LMA  with reglan)        Anesthesia Quick Evaluation

## 2012-05-18 NOTE — Transfer of Care (Signed)
Immediate Anesthesia Transfer of Care Note  Patient: Gabriella Martinez  Procedure(s) Performed: Procedure(s) (LRB): CYSTOSCOPY WITH RETROGRADE PYELOGRAM/URETERAL STENT PLACEMENT (Left)  Patient Location: PACU  Anesthesia Type: General  Level of Consciousness: awake, alert , oriented and patient cooperative  Airway & Oxygen Therapy: Patient Spontanous Breathing and Patient connected to face mask oxygen  Post-op Assessment: Report given to PACU RN, Post -op Vital signs reviewed and stable and Patient moving all extremities  Post vital signs: Reviewed and stable  Complications: No apparent anesthesia complications

## 2012-05-18 NOTE — H&P (Signed)
Gabriella Martinez is an 31 y.o. female.   Chief Complaint: Left ureteral calculus with chills and abnormal urinalysis HPI: Patient presented originally 6 weeks ago with left flank pain was diagnosed with a 3 mm proximal left ureteral calculus. She's continued intermittently have pain over the last 6 weeks. She reports several days of chills and questionable fever. She presented back to the emergency room where she was noted to have an elevated white blood cell count 13,000. Urinalysis suspicious for ongoing bacterial cystitis. We were consulted for assessment.  Past Medical History  Diagnosis Date  . Kidney stones     No past surgical history on file.  No family history on file. Social History:  reports that she has been smoking.  She does not have any smokeless tobacco history on file. She reports that she drinks alcohol. She reports that she does not use illicit drugs.  Allergies: No Known Allergies   (Not in a hospital admission)  Results for orders placed during the hospital encounter of 05/18/12 (from the past 48 hour(s))  URINALYSIS, ROUTINE W REFLEX MICROSCOPIC     Status: Abnormal   Collection Time   05/18/12 10:35 AM      Component Value Range Comment   Color, Urine YELLOW  YELLOW    APPearance CLOUDY (*) CLEAR    Specific Gravity, Urine 1.029  1.005 - 1.030    pH 6.0  5.0 - 8.0    Glucose, UA 100 (*) NEGATIVE mg/dL    Hgb urine dipstick MODERATE (*) NEGATIVE    Bilirubin Urine NEGATIVE  NEGATIVE    Ketones, ur TRACE (*) NEGATIVE mg/dL    Protein, ur 100 (*) NEGATIVE mg/dL    Urobilinogen, UA 1.0  0.0 - 1.0 mg/dL    Nitrite NEGATIVE  NEGATIVE    Leukocytes, UA MODERATE (*) NEGATIVE   PREGNANCY, URINE     Status: Normal   Collection Time   05/18/12 10:35 AM      Component Value Range Comment   Preg Test, Ur NEGATIVE  NEGATIVE   URINE MICROSCOPIC-ADD ON     Status: Abnormal   Collection Time   05/18/12 10:35 AM      Component Value Range Comment   Squamous Epithelial /  LPF FEW (*) RARE    WBC, UA 11-20  <3 WBC/hpf    RBC / HPF 7-10  <3 RBC/hpf    Bacteria, UA MANY (*) RARE    Urine-Other MUCOUS PRESENT     CBC     Status: Abnormal   Collection Time   05/18/12 11:10 AM      Component Value Range Comment   WBC 13.9 (*) 4.0 - 10.5 K/uL    RBC 3.79 (*) 3.87 - 5.11 MIL/uL    Hemoglobin 11.4 (*) 12.0 - 15.0 g/dL    HCT 34.2 (*) 36.0 - 46.0 %    MCV 90.2  78.0 - 100.0 fL    MCH 30.1  26.0 - 34.0 pg    MCHC 33.3  30.0 - 36.0 g/dL    RDW 13.2  11.5 - 15.5 %    Platelets 174  150 - 400 K/uL   DIFFERENTIAL     Status: Abnormal   Collection Time   05/18/12 11:10 AM      Component Value Range Comment   Neutrophils Relative 88 (*) 43 - 77 %    Lymphocytes Relative 6 (*) 12 - 46 %    Monocytes Relative 6  3 - 12 %      Eosinophils Relative 0  0 - 5 %    Basophils Relative 0  0 - 1 %    Neutro Abs 12.3 (*) 1.7 - 7.7 K/uL    Lymphs Abs 0.8  0.7 - 4.0 K/uL    Monocytes Absolute 0.8  0.1 - 1.0 K/uL    Eosinophils Absolute 0.0  0.0 - 0.7 K/uL    Basophils Absolute 0.0  0.0 - 0.1 K/uL    WBC Morphology TOXIC GRANULATION     COMPREHENSIVE METABOLIC PANEL     Status: Abnormal   Collection Time   05/18/12 11:10 AM      Component Value Range Comment   Sodium 136  135 - 145 mEq/L    Potassium 3.4 (*) 3.5 - 5.1 mEq/L    Chloride 103  96 - 112 mEq/L    CO2 22  19 - 32 mEq/L    Glucose, Bld 130 (*) 70 - 99 mg/dL    BUN 11  6 - 23 mg/dL    Creatinine, Ser 0.89  0.50 - 1.10 mg/dL    Calcium 9.2  8.4 - 10.5 mg/dL    Total Protein 6.8  6.0 - 8.3 g/dL    Albumin 3.3 (*) 3.5 - 5.2 g/dL    AST 19  0 - 37 U/L    ALT 16  0 - 35 U/L    Alkaline Phosphatase 71  39 - 117 U/L    Total Bilirubin 0.4  0.3 - 1.2 mg/dL    GFR calc non Af Amer 85 (*) >90 mL/min    GFR calc Af Amer >90  >90 mL/min    Ct Abdomen Pelvis Wo Contrast  05/18/2012  *RADIOLOGY REPORT*  Clinical Data: Left-sided flank pain, fever, dysuria.  History of proximal left ureteral calculus on prior CT  03/30/2012  CT ABDOMEN AND PELVIS WITHOUT CONTRAST  Technique:  Multidetector CT imaging of the abdomen and pelvis was performed following the standard protocol without intravenous contrast.  Comparison: CT 03/30/2012, ultrasound 05/04/2012  Findings: Lung bases are clear.  The previously seen left ureteral calculus measuring 4 mm has passed distally to the level of the distal ureter, image 58.  There is mild left intrarenal collecting system fullness, decreased from previously, but new left perinephric stranding.  No other radiopaque left renal or ureteral calculus is identified.  1 mm nonobstructing right lower renal pole calculi again noted  Unenhanced gallbladder, adrenal glands, and pancreas are normal. Visualized aspects of liver and spleen are normal.  Appendix is normal.  No bowel wall thickening or focal segmental dilatation.  Uterus and ovaries are normal.  No pelvic, retroperitoneal, or abdominal lymphadenopathy.  No free air.  No new osseous abnormality.  IMPRESSION: Interval distal passage of previously seen 4 mm left ureteral calculus to the level of the anatomic pelvis.  Mild fullness of the left intrarenal collecting system and trace left perinephric stranding.  Stable nonobstructing 1 mm right lower renal pole calculi.  Original Report Authenticated By: GRETCHEN E. GREEN, M.D.    Review of Systems - Negative except chills, abdominal pain, flank pain, nausea.  Blood pressure 93/63, pulse 86, temperature 98 F (36.7 C), resp. rate 20, last menstrual period 04/13/2012, SpO2 95.00%. General appearance: alert, cooperative and no distress Neck: no adenopathy and no JVD Resp: clear to auscultation bilaterally Cardio: regular rate and rhythm GI: soft, non-tender; bowel sounds normal; no masses,  no organomegaly Extremities: extremities normal, atraumatic, no cyanosis or edema Skin: Skin color, texture, turgor normal.   No rashes or lesions Neurologic: Grossly normal  Assessment/Plan Vision  has a distal 3-4 mm ureteral calculus. She has had this stone presumptively now for 6 weeks plus. She does have potentially evidence of a concurrent urinary tract infection and has a leukocytosis has experienced at least low-grade fever and chills. The safest way to proceed would be semiurgent double-J stent placement to assure adequate drainage of the kidney. I would not recommend definitive stone management with basketing or laser in this situation. We will plan on subsequent definitive ureteroscopy 1-2 weeks status post stent placement. She is received Rocephin and urine culture has been obtained. She will be kept overnight for observation status post the procedure.  Kyen Taite S 05/18/2012, 1:04 PM   

## 2012-05-18 NOTE — ED Notes (Signed)
Patient's mother took belongings bag x 1 and a purse with a cell phone.

## 2012-05-18 NOTE — Preoperative (Addendum)
Beta Blockers   Reason not to administer Beta Blockers:Not Applicable 

## 2012-05-18 NOTE — ED Notes (Signed)
To OR via stretcher-accompanied by OR transporter. Patient's mother with patient.

## 2012-05-18 NOTE — Op Note (Signed)
Preoperative diagnosis:  1. Left ureteral calculus with concurrent urinary tract infection.   Postoperative diagnosis:  1. Same   Procedure:  1. Cystoscopy 2. Left ureteral stent placement (6 [place]) 24 cm 3. Left retrograde pyelography with interpretation   Surgeon: Valetta Fuller, MD  Anesthesia: General  Complications: None  Intraoperative findings: Left retrograde pyelogram demonstrated a filling defect approximately 4-5 cm above the ureterovesical junction.  EBL: Minimal  Specimens: None  Indication: Gabriella Martinez is a 31 y.o. patient with a six-week history of a left ureteral calculus. She initially presented and was found to have a proximal left ureteral stone. She represented to the emergency room where CT demonstrated the stone now to be in the distal ureter. She had evidence of a concurrent urinary tract infection with some chills and elevated white blood cell count. We recommended stent placement for definitive decompression and no attempt at definitive stone management at this time.. After reviewing the management options for treatment, she elected to proceed with the above surgical procedure(s). We have discussed the potential benefits and risks of the procedure, side effects of the proposed treatment, the likelihood of the patient achieving the goals of the procedure, and any potential problems that might occur during the procedure or recuperation. Informed consent has been obtained.  Description of procedure:  The patient was taken to the operating room and general anesthesia was induced.  The patient was placed in the dorsal lithotomy position, prepped and draped in the usual sterile fashion, and preoperative antibiotics were administered. A preoperative time-out was performed.   Cystourethroscopy was performed.  The bladder was then systematically examined in its entirety. There was no evidence for any bladder tumors, stones, or other mucosal pathology.    Attention  then turned to the left ureteral orifice and a ureteral catheter was used to intubate the ureteral orifice.  Omnipaque contrast was injected through the ureteral catheter and a retrograde pyelogram was performed with findings as dictated above.  A 0.38 sensor guidewire was then advanced up the left ureter into the renal pelvis under fluoroscopic guidance.  The wire was then backloaded through the cystoscope and a ureteral stent was advance over the wire using Seldinger technique.  The stent was positioned appropriately under fluoroscopic and cystoscopic guidance.  The wire was then removed with an adequate stent curl noted in the renal pelvis as well as in the bladder.  The bladder was then emptied and the procedure ended.  The patient appeared to tolerate the procedure well and without complications.  The patient was able to be awakened and transferred to the recovery unit in satisfactory condition.    Valetta Fuller, MD

## 2012-05-18 NOTE — ED Provider Notes (Signed)
History     CSN: [id number]  Arrival date & time 05/18/12  1008   First MD Initiated Contact with Patient 05/18/12 1021      Chief Complaint  Patient presents with  . Nephrolithiasis    (Consider location/radiation/quality/duration/timing/severity/associated sxs/prior treatment) HPI Pt presents with 3 days of nausea, vomiting, l flank pain, dysuria, subjective fever and chills. Was seen in 4/13 with 3mm L ureteral calculus. States she never felt it pass. Seen last month and had renal US that was normal and diagnosed with pyelonephritis. She denies hematuria, waxing and waning pain, abd pain, vaginal compalints Past Medical History  Diagnosis Date  . Kidney stones     No past surgical history on file.  No family history on file.  History  Substance Use Topics  . Smoking status: Current Everyday Smoker  . Smokeless tobacco: Not on file  . Alcohol Use: Yes    OB History    Grav Para Term Preterm Abortions TAB SAB Ect Mult Living                  Review of Systems  Constitutional: Positive for fever, chills and diaphoresis.  HENT: Negative for neck pain.   Respiratory: Negative for shortness of breath.   Cardiovascular: Negative for chest pain and palpitations.  Gastrointestinal: Positive for nausea and vomiting. Negative for abdominal pain, diarrhea and constipation.  Genitourinary: Positive for dysuria and flank pain. Negative for hematuria, vaginal bleeding and vaginal discharge.  Musculoskeletal: Positive for back pain. Negative for myalgias.  Skin: Negative for pallor and rash.  Neurological: Negative for dizziness, syncope, numbness and headaches.    Allergies  Review of patient's allergies indicates no known allergies.  Home Medications   Current Outpatient Rx  Name Route Sig Dispense Refill  . IBUPROFEN 200 MG PO TABS Oral Take 200-400 mg by mouth every 6 (six) hours as needed. pain    . ORTHO TRI-CYCLEN LO PO Oral Take 1 tablet by mouth daily.    Marland Kitchen  PROMETHAZINE HCL 25 MG PO TABS Oral Take 1 tablet (25 mg total) by mouth every 6 (six) hours as needed for nausea. 30 tablet 0    BP 93/63  Pulse 86  Temp 98 F (36.7 C)  Resp 20  SpO2 95%  LMP 04/13/2012  Physical Exam  Nursing note and vitals reviewed. Constitutional: She is oriented to person, place, and time. She appears well-developed and well-nourished. She appears distressed.  HENT:  Head: Normocephalic and atraumatic.  Mouth/Throat: Oropharynx is clear and moist.  Eyes: EOM are normal. Pupils are equal, round, and reactive to light.  Neck: Normal range of motion. Neck supple.  Cardiovascular: Normal rate and regular rhythm.   Pulmonary/Chest: Effort normal and breath sounds normal. No respiratory distress. She has no wheezes. She has no rales.  Abdominal: Soft. Bowel sounds are normal. She exhibits no mass. There is tenderness (mild LLQ TTP. No rebound or guarding). There is no rebound and no guarding.  Musculoskeletal: Normal range of motion. She exhibits tenderness (L flank tenderness to percussion). She exhibits no edema.  Neurological: She is alert and oriented to person, place, and time.  Skin: Skin is warm. No rash noted. She is diaphoretic. No erythema.  Psychiatric: She has a normal mood and affect. Her behavior is normal.    ED Course  Procedures (including critical care time)  Labs Reviewed  CBC - Abnormal; Notable for the following:    WBC 13.9 (*)     RBC 3.79 (*)  Hemoglobin 11.4 (*)     HCT 34.2 (*)     All other components within normal limits  DIFFERENTIAL - Abnormal; Notable for the following:    Neutrophils Relative 88 (*)     Lymphocytes Relative 6 (*)     Neutro Abs 12.3 (*)     All other components within normal limits  COMPREHENSIVE METABOLIC PANEL - Abnormal; Notable for the following:    Potassium 3.4 (*)     Glucose, Bld 130 (*)     Albumin 3.3 (*)     GFR calc non Af Amer 85 (*)     All other components within normal limits    URINALYSIS, ROUTINE W REFLEX MICROSCOPIC - Abnormal; Notable for the following:    APPearance CLOUDY (*)     Glucose, UA 100 (*)     Hgb urine dipstick MODERATE (*)     Ketones, ur TRACE (*)     Protein, ur 100 (*)     Leukocytes, UA MODERATE (*)     All other components within normal limits  URINE MICROSCOPIC-ADD ON - Abnormal; Notable for the following:    Squamous Epithelial / LPF FEW (*)     Bacteria, UA MANY (*)     All other components within normal limits  PREGNANCY, URINE  URINE CULTURE   Ct Abdomen Pelvis Wo Contrast  05/18/2012  *RADIOLOGY REPORT*  Clinical Data: Left-sided flank pain, fever, dysuria.  History of proximal left ureteral calculus on prior CT 03/30/2012  CT ABDOMEN AND PELVIS WITHOUT CONTRAST  Technique:  Multidetector CT imaging of the abdomen and pelvis was performed following the standard protocol without intravenous contrast.  Comparison: CT 03/30/2012, ultrasound 05/04/2012  Findings: Lung bases are clear.  The previously seen left ureteral calculus measuring 4 mm has passed distally to the level of the distal ureter, image 58.  There is mild left intrarenal collecting system fullness, decreased from previously, but new left perinephric stranding.  No other radiopaque left renal or ureteral calculus is identified.  1 mm nonobstructing right lower renal pole calculi again noted  Unenhanced gallbladder, adrenal glands, and pancreas are normal. Visualized aspects of liver and spleen are normal.  Appendix is normal.  No bowel wall thickening or focal segmental dilatation.  Uterus and ovaries are normal.  No pelvic, retroperitoneal, or abdominal lymphadenopathy.  No free air.  No new osseous abnormality.  IMPRESSION: Interval distal passage of previously seen 4 mm left ureteral calculus to the level of the anatomic pelvis.  Mild fullness of the left intrarenal collecting system and trace left perinephric stranding.  Stable nonobstructing 1 mm right lower renal pole calculi.   Original Report Authenticated By: Harrel Lemon, M.D.     1. UTI (lower urinary tract infection)   2. Left ureteral calculus       MDM  Discussed with Dr Isabel Caprice. OK with rocephin and urine culture. Asked to make NPO and he will see pt in ED        Loren Racer, MD 05/18/12 1331

## 2012-05-18 NOTE — Anesthesia Procedure Notes (Signed)
Procedure Name: LMA Insertion Date/Time: 05/18/2012 3:54 PM Performed by: Edison Pace Pre-anesthesia Checklist: Patient identified, Timeout performed, Emergency Drugs available, Suction available and Patient being monitored Patient Re-evaluated:Patient Re-evaluated prior to inductionOxygen Delivery Method: Circle system utilized Preoxygenation: Pre-oxygenation with 100% oxygen Intubation Type: IV induction LMA: LMA inserted LMA Size: 4.0 Number of attempts: 1

## 2012-05-19 ENCOUNTER — Other Ambulatory Visit: Payer: Self-pay | Admitting: Urology

## 2012-05-19 ENCOUNTER — Encounter (HOSPITAL_COMMUNITY): Payer: Self-pay | Admitting: Urology

## 2012-05-19 LAB — URINE CULTURE
Colony Count: NO GROWTH
Culture  Setup Time: 201306131418
Culture: NO GROWTH

## 2012-05-19 MED ORDER — OXYBUTYNIN CHLORIDE 5 MG PO TABS: 5.0000 mg | ORAL_TABLET | Freq: Three times a day (TID) | ORAL | Status: DC | PRN

## 2012-05-19 MED ORDER — HYDROCODONE-ACETAMINOPHEN 5-325 MG PO TABS: 1.0000 | ORAL_TABLET | ORAL | Status: AC | PRN

## 2012-05-19 NOTE — Discharge Instructions (Signed)

## 2012-05-19 NOTE — Progress Notes (Signed)
Patient given all d/c instructions and f/u information. Signif other at bedside. Patient requested Percocet vs Norco for pain control. notiied md.  Also Md to provide pt with antibiotic prescrption that was not included in d/c packet. Pt states she is pain free at this time.

## 2012-05-19 NOTE — Discharge Summary (Signed)
Physician Discharge Summary  Patient ID: Gabriella Martinez MRN: [id number] DOB/AGE: [birth year] 31 y.o.  Admit date: 05/18/2012 Discharge date: 05/19/2012  Admission Diagnoses:  Discharge Diagnoses:  Active Problems:  * No active hospital problems. *    Discharged Condition: good  Hospital Course: Patient presented with left flank pain. She had been diagnosed with a left proximal ureteral stone 6 weeks earlier. The patient felt better clinically but then continued to have intermittent episodes of recurrent pain. The patient repositioned in the emergency room with several days of fever and increasing left flank pain. The same stone was now noted to be in the distal left ureter. What blood cell count was elevated and the patient was felt to have a concurrent urinary tract infection. We felt the safest way to proceed with placement of a double-J stent to assure adequate drainage of left kidney with definitive stone management at a different time. A left double-J stent was placed without difficulty. The patient was kept overnight and had no obvious clinical problems other than discomfort from the stent and some urinary frequency.  Consults: None  Significant Diagnostic Studies: CT scan  Treatments: surgery: Cystoscopy with left retrograde pyelogram and left double-J stent placement.  Discharge Exam: Blood pressure 129/83, pulse 71, temperature 97.7 F (36.5 C), temperature source Oral, resp. rate 16, height 5\' 3"  (1.6 m), weight 65.772 kg (145 lb), last menstrual period 04/13/2012, SpO2 97.00%. Will fall well-nourished female in no acute distress. Normal respiratory effort. Heart regular rate and rhythm. Abdomen benign. Extremities unremarkable.  Disposition: 01-Home or Self Care   Medication List  As of 05/19/2012  8:25 AM   STOP taking these medications         promethazine 25 MG tablet         TAKE these medications         HYDROcodone-acetaminophen 5-325 MG per tablet   Commonly  known as: NORCO   Take 1-2 tablets by mouth every 4 (four) hours as needed.      ibuprofen 200 MG tablet   Commonly known as: ADVIL,MOTRIN   Take 200-400 mg by mouth every 6 (six) hours as needed. pain      ORTHO TRI-CYCLEN LO PO   Take 1 tablet by mouth daily.      oxybutynin 5 MG tablet   Commonly known as: DITROPAN   Take 1 tablet (5 mg total) by mouth every 8 (eight) hours as needed.           Follow-up Information    Please follow up. (we will call you)          Signed: Gaetana Kawahara S 05/19/2012, 8:25 AM

## 2012-05-24 ENCOUNTER — Encounter (HOSPITAL_COMMUNITY): Payer: Self-pay | Admitting: *Deleted

## 2012-05-24 NOTE — Progress Notes (Signed)
Retro review completed. Abelino Derrick RN

## 2012-05-24 NOTE — Pre-Procedure Instructions (Signed)
PT STATES NO CHANGES IN HER MEDICAL HISTORY SINCE HER SURGERY LAST Thursday ON 05/18/12 - CYSTO, STENT PLACEMENT.  PT STATES SHE DOES NOT KNOW THE EXACT DATE OF HER LAST MENSTRUAL PERIOD - BUT IT WAS ONE DAY LAST WEEK.  PREOP INSTRUCTIONS WERE GIVEN TO PT-INCLUDING NPO AFTER MN AND ARRIVAL TO SHORT STAY BY 9 AM ON 7/1--TAKE BIRTH CONTROL PILL WITH SIP WATER AM OF SURGERY--MAY TAKE PERCOCET IF NEEDED FOR PAIN.  PT INSTRUCTED TO SHOWER WITH BETASEPT SOAP NIGHT BEFORE HER SURGERY AND THE AM OF SURGERY--BUT NOT TO USE IT ON HER FACE, HEAD, PRIVATE AREAS-AND NOT TO SHAVE HER SKIN 48 HRS PRIOR TO USING BETASEPT TO AVOID SKIN IRRITATION.  PT STATES HER MOTHER OR HER BOYFRIEND WILL DRIVE HER HOME AFTER HER SURGERY.

## 2012-05-31 ENCOUNTER — Emergency Department (HOSPITAL_COMMUNITY)
Admission: EM | Admit: 2012-05-31 | Discharge: 2012-05-31 | Disposition: A | Discharge status: Home / Self Care | Payer: Self-pay | Attending: Emergency Medicine | Admitting: Emergency Medicine

## 2012-05-31 ENCOUNTER — Encounter (HOSPITAL_COMMUNITY): Payer: Self-pay | Admitting: *Deleted

## 2012-05-31 DIAGNOSIS — R319 Hematuria, unspecified: Secondary | ICD-10-CM | POA: Insufficient documentation

## 2012-05-31 DIAGNOSIS — R109 Unspecified abdominal pain: Secondary | ICD-10-CM | POA: Insufficient documentation

## 2012-05-31 DIAGNOSIS — Z87442 Personal history of urinary calculi: Secondary | ICD-10-CM | POA: Insufficient documentation

## 2012-05-31 DIAGNOSIS — Z79899 Other long term (current) drug therapy: Secondary | ICD-10-CM | POA: Insufficient documentation

## 2012-05-31 DIAGNOSIS — F17200 Nicotine dependence, unspecified, uncomplicated: Secondary | ICD-10-CM | POA: Insufficient documentation

## 2012-05-31 LAB — CBC WITH DIFFERENTIAL/PLATELET
Basophils Relative: 1 % (ref 0–1)
HCT: 34.8 % — ABNORMAL LOW (ref 36.0–46.0)
Hemoglobin: 11.4 g/dL — ABNORMAL LOW (ref 12.0–15.0)
Lymphs Abs: 2 10*3/uL (ref 0.7–4.0)
MCH: 29.7 pg (ref 26.0–34.0)
MCHC: 32.8 g/dL (ref 30.0–36.0)
Monocytes Absolute: 0.5 10*3/uL (ref 0.1–1.0)
Monocytes Relative: 6 % (ref 3–12)
Neutro Abs: 5.2 10*3/uL (ref 1.7–7.7)

## 2012-05-31 LAB — URINALYSIS, MICROSCOPIC ONLY
Glucose, UA: NEGATIVE mg/dL
Specific Gravity, Urine: 1.033 — ABNORMAL HIGH (ref 1.005–1.030)

## 2012-05-31 LAB — BASIC METABOLIC PANEL
BUN: 13 mg/dL (ref 6–23)
Chloride: 105 mEq/L (ref 96–112)
GFR calc Af Amer: >90 mL/min (ref >90)
Glucose, Bld: 89 mg/dL (ref 70–99)
Potassium: 4.2 mEq/L (ref 3.5–5.1)

## 2012-05-31 MED ORDER — ONDANSETRON HCL 4 MG/2ML IJ SOLN
4.0000 mg | Freq: Once | INTRAMUSCULAR | Status: AC
  Administered 2012-05-31: 4 mg via INTRAVENOUS
  Filled 2012-05-31: qty 2

## 2012-05-31 MED ORDER — PHENAZOPYRIDINE HCL 100 MG PO TABS
100.0000 mg | ORAL_TABLET | Freq: Three times a day (TID) | ORAL | Status: DC
  Administered 2012-05-31: 100 mg via ORAL
  Filled 2012-05-31 (×2): qty 1

## 2012-05-31 MED ORDER — HYDROMORPHONE HCL PF 1 MG/ML IJ SOLN
1.0000 mg | Freq: Once | INTRAMUSCULAR | Status: AC
  Administered 2012-05-31: 1 mg via INTRAVENOUS
  Filled 2012-05-31: qty 1

## 2012-05-31 MED ORDER — SODIUM CHLORIDE 0.9 % IV BOLUS (SEPSIS)
500.0000 mL | Freq: Once | INTRAVENOUS | Status: AC
  Administered 2012-05-31: 500 mL via INTRAVENOUS

## 2012-05-31 MED ORDER — HYDROCODONE-ACETAMINOPHEN 5-500 MG PO TABS: 1.0000 | ORAL_TABLET | Freq: Four times a day (QID) | ORAL | Status: AC | PRN

## 2012-05-31 NOTE — ED Notes (Signed)
Pt reports she was seen here 2 weeks ago, stent placed left kidney. Reports sharp/ flank pain and hematuria x2 days. States she has appt July 1st to follow up/have stent removed, states MD on vacation this week.

## 2012-05-31 NOTE — Discharge Instructions (Signed)
Finish your cipro. Alternate between ibuprofen and vicodin for pain. Call Dr. Jobie Quaker office tomorrow to discuss closer follow up for stent removal than July 1st. Return to ER at any time for emergent changing or worsening of symptoms.

## 2012-05-31 NOTE — ED Provider Notes (Signed)
History     CSN: [id number]  Arrival date & time 05/31/12  1121   First MD Initiated Contact with Patient 05/31/12 1218      Chief Complaint  Patient presents with  . Hematuria  . Flank Pain    (Consider location/radiation/quality/duration/timing/severity/associated sxs/prior treatment) HPI  Patient who had left stent placement and kidney by Dr. Isabel Caprice on 613 for nephrolithiasis and was placed on Cipro for associated urinary tract infection presents to the emergency department complaining of a three-day history of dysuria, hematuria, and ongoing left flank pain. Patient states she has been taking her cipro as directed and is still on cipro. Patient states she felt improved for approximately 3-4 days but then had gradual return of pain. Patient states she's had mild dysuria but worsening discomfort over the last 2-3 days with acute onset hematuria. Patient states she's scheduled to have the stent removed on July 1 however she called the urologist office today was unable to speak with Dr. Isabel Caprice because he was out of town. She denies fevers, chills, chest pain, shortness breath, abdominal pain, nausea, vomiting, diarrhea or blood in her stool. Shunt aggravating or alleviating factors. Patient initially mild improvement with her Percocet for then symptoms return.  Past Medical History  Diagnosis Date  . Kidney stones     Past Surgical History  Procedure Date  . Lithotripsy   . Cystoscopy w/ ureteral stent placement 05/18/2012    Procedure: CYSTOSCOPY WITH RETROGRADE PYELOGRAM/URETERAL STENT PLACEMENT;  Surgeon: Valetta Fuller, MD;  Location: [facility];  Service: Urology;  Laterality: Left;    No family history on file.  History  Substance Use Topics  . Smoking status: Current Everyday Smoker  . Smokeless tobacco: Never Used  . Alcohol Use: Yes    OB History    Grav Para Term Preterm Abortions TAB SAB Ect Mult Living                  Review of Systems  All other systems  reviewed and are negative.    Allergies  Review of patient's allergies indicates no known allergies.  Home Medications   Current Outpatient Rx  Name Route Sig Dispense Refill  . CIPROFLOXACIN HCL 250 MG PO TABS Oral Take 250 mg by mouth daily.    Marland Kitchen DIPHENHYDRAMINE-ZINC ACETATE 1-0.1 % EX CREA Topical Apply 1 application topically daily as needed. Apply to affected areas on legs    . IBUPROFEN 200 MG PO TABS Oral Take 800 mg by mouth every 6 (six) hours as needed. For pain    . ORTHO TRI-CYCLEN LO PO Oral Take 1 tablet by mouth daily. Take every morning    . OXYBUTYNIN CHLORIDE 5 MG PO TABS Oral Take 5 mg by mouth every 8 (eight) hours.    . OXYCODONE-ACETAMINOPHEN 5-325 MG PO TABS Oral Take 1 tablet by mouth every 4 (four) hours as needed. For pain      BP 126/87  Pulse 76  Temp 98.2 F (36.8 C) (Oral)  Resp 16  Ht 5\' 3"  (1.6 m)  Wt 145 lb (65.772 kg)  BMI 25.69 kg/m2  SpO2 99%  LMP 05/17/2012  Physical Exam  Nursing note and vitals reviewed. Constitutional: She is oriented to person, place, and time. She appears well-developed and well-nourished. No distress.  HENT:  Head: Normocephalic and atraumatic.  Eyes: Conjunctivae are normal.  Neck: Normal range of motion. Neck supple.  Cardiovascular: Normal rate, regular rhythm, normal heart sounds and intact distal pulses.  Exam  reveals no gallop and no friction rub.   No murmur heard. Pulmonary/Chest: Effort normal and breath sounds normal. No respiratory distress. She has no wheezes. She has no rales. She exhibits no tenderness.  Abdominal: Soft. Bowel sounds are normal. She exhibits no distension and no mass. There is no tenderness. There is no rebound and no guarding.       Mild left CVA TTP.   Musculoskeletal: Normal range of motion.  Neurological: She is alert and oriented to person, place, and time.  Skin: Skin is warm and dry. No rash noted. She is not diaphoretic. No erythema.  Psychiatric: She has a normal mood and  affect.    ED Course  Procedures (including critical care time)  IV dilaudid and zofran. PO pyridium.   Labs Reviewed  URINALYSIS, WITH MICROSCOPIC - Abnormal; Notable for the following:    Color, Urine RED (*)  BIOCHEMICALS MAY BE AFFECTED BY COLOR   APPearance CLOUDY (*)     Specific Gravity, Urine 1.033 (*)     Hgb urine dipstick LARGE (*)     Ketones, ur TRACE (*)     Protein, ur 100 (*)     Leukocytes, UA MODERATE (*)     Bacteria, UA MANY (*)     Squamous Epithelial / LPF MANY (*)     All other components within normal limits  CBC WITH DIFFERENTIAL - Abnormal; Notable for the following:    RBC 3.84 (*)     Hemoglobin 11.4 (*)     HCT 34.8 (*)     Platelets 436 (*)     All other components within normal limits  BASIC METABOLIC PANEL  POCT PREGNANCY, URINE  URINE CULTURE   No results found.   1. Flank pain   2. Hematuria     2:39 PM I discussed history, prior imaging, current symptoms and labs with Dr. Sherron Monday who states that patient's urine should be culutured once again but that she should continue on cipro until follow up with urology. He is requesting pain management in ER and as long as patient remains afebrile, non toxic appearing and symptomatic improvement then d/c home to call urology office in the morning to discuss earlier f/u appointment than July 1st if needed.   MDM  Patient states her pain has improved. She is agreeable to close follow up with urology but returning for changing or worsening of symptoms. She is afebrile and non toxic appearing with a soft and non tender abdomen.         [place], [place] 05/31/12 1517

## 2012-05-31 NOTE — ED Provider Notes (Signed)
Medical screening examination/treatment/procedure(s) were performed by non-physician practitioner and as supervising physician I was immediately available for consultation/collaboration.  Emonte Dieujuste L Rishit Burkhalter, MD 05/31/12 1907 

## 2012-05-31 NOTE — ED Notes (Signed)
Pt reports having cystoscopy with ureteral stent placed 2 weeks ago. States pain and hematuria began 2 days ago, swollen area noted where stent placed.

## 2012-06-01 LAB — URINE CULTURE

## 2012-06-05 ENCOUNTER — Ambulatory Visit (HOSPITAL_COMMUNITY): Payer: Self-pay | Admitting: Anesthesiology

## 2012-06-05 ENCOUNTER — Ambulatory Visit (HOSPITAL_COMMUNITY)
Admission: RE | Admit: 2012-06-05 | Discharge: 2012-06-05 | Disposition: A | Discharge status: Home / Self Care | Payer: Self-pay | Source: Ambulatory Visit | Attending: Urology | Admitting: Urology

## 2012-06-05 ENCOUNTER — Encounter (HOSPITAL_COMMUNITY): Payer: Self-pay | Admitting: *Deleted

## 2012-06-05 ENCOUNTER — Encounter (HOSPITAL_COMMUNITY): Payer: Self-pay | Admitting: Anesthesiology

## 2012-06-05 ENCOUNTER — Encounter (HOSPITAL_COMMUNITY)
Admission: RE | Disposition: A | Discharge status: Home / Self Care | Payer: Self-pay | Source: Ambulatory Visit | Attending: Urology

## 2012-06-05 DIAGNOSIS — N201 Calculus of ureter: Secondary | ICD-10-CM | POA: Insufficient documentation

## 2012-06-05 HISTORY — PX: URETEROSCOPY: SHX842

## 2012-06-05 HISTORY — PX: CYSTOSCOPY W/ URETERAL STENT REMOVAL: SHX1430

## 2012-06-05 LAB — SURGICAL PCR SCREEN: Staphylococcus aureus: NEGATIVE

## 2012-06-05 SURGERY — URETEROSCOPY
Anesthesia: General | Site: Ureter | Laterality: Left | Wound class: Clean Contaminated

## 2012-06-05 MED ORDER — ONDANSETRON HCL 4 MG/2ML IJ SOLN
INTRAMUSCULAR | Status: DC | PRN
  Administered 2012-06-05: 4 mg via INTRAVENOUS

## 2012-06-05 MED ORDER — FENTANYL CITRATE 0.05 MG/ML IJ SOLN
25.0000 ug | INTRAMUSCULAR | Status: DC | PRN
  Administered 2012-06-05 (×3): 50 ug via INTRAVENOUS

## 2012-06-05 MED ORDER — LIDOCAINE HCL 2 % EX GEL
CUTANEOUS | Status: AC
  Filled 2012-06-05: qty 10

## 2012-06-05 MED ORDER — ACETAMINOPHEN 10 MG/ML IV SOLN
INTRAVENOUS | Status: AC
  Filled 2012-06-05: qty 100

## 2012-06-05 MED ORDER — LIDOCAINE HCL 2 % EX GEL
CUTANEOUS | Status: DC | PRN
  Administered 2012-06-05: 1 via URETHRAL

## 2012-06-05 MED ORDER — FENTANYL CITRATE 0.05 MG/ML IJ SOLN
INTRAMUSCULAR | Status: AC
  Filled 2012-06-05: qty 2

## 2012-06-05 MED ORDER — ACETAMINOPHEN 10 MG/ML IV SOLN
INTRAVENOUS | Status: DC | PRN
  Administered 2012-06-05: 1000 mg via INTRAVENOUS

## 2012-06-05 MED ORDER — 0.9 % SODIUM CHLORIDE (POUR BTL) OPTIME
TOPICAL | Status: DC | PRN
  Administered 2012-06-05: 1000 mL

## 2012-06-05 MED ORDER — IOHEXOL 300 MG/ML  SOLN
INTRAMUSCULAR | Status: AC
  Filled 2012-06-05: qty 1

## 2012-06-05 MED ORDER — KETOROLAC TROMETHAMINE 30 MG/ML IJ SOLN
INTRAMUSCULAR | Status: DC | PRN
  Administered 2012-06-05: 30 mg via INTRAVENOUS

## 2012-06-05 MED ORDER — PROPOFOL 10 MG/ML IV EMUL
INTRAVENOUS | Status: DC | PRN
  Administered 2012-06-05: 200 mg via INTRAVENOUS

## 2012-06-05 MED ORDER — SODIUM CHLORIDE 0.9 % IR SOLN
Status: DC | PRN
  Administered 2012-06-05: 6000 mL

## 2012-06-05 MED ORDER — OXYCODONE-ACETAMINOPHEN 5-325 MG PO TABS
ORAL_TABLET | ORAL | Status: AC
  Administered 2012-06-05: 2
  Filled 2012-06-05: qty 2

## 2012-06-05 MED ORDER — FENTANYL CITRATE 0.05 MG/ML IJ SOLN
INTRAMUSCULAR | Status: DC | PRN
  Administered 2012-06-05 (×4): 50 ug via INTRAVENOUS

## 2012-06-05 MED ORDER — MUPIROCIN 2 % EX OINT
TOPICAL_OINTMENT | CUTANEOUS | Status: AC
  Filled 2012-06-05: qty 22

## 2012-06-05 MED ORDER — PROMETHAZINE HCL 25 MG/ML IJ SOLN: 6.2500 mg | INTRAMUSCULAR | Status: DC | PRN

## 2012-06-05 MED ORDER — MIDAZOLAM HCL 5 MG/5ML IJ SOLN
INTRAMUSCULAR | Status: DC | PRN
  Administered 2012-06-05: 1 mg via INTRAVENOUS

## 2012-06-05 MED ORDER — FENTANYL CITRATE 0.05 MG/ML IJ SOLN
50.0000 ug | INTRAMUSCULAR | Status: DC | PRN
  Administered 2012-06-05: 100 ug via INTRAVENOUS

## 2012-06-05 MED ORDER — OXYCODONE-ACETAMINOPHEN 5-325 MG PO TABS: 2.0000 | ORAL_TABLET | Freq: Once | ORAL | Status: DC

## 2012-06-05 MED ORDER — CIPROFLOXACIN IN D5W 400 MG/200ML IV SOLN
400.0000 mg | INTRAVENOUS | Status: AC
  Administered 2012-06-05: 400 mg via INTRAVENOUS

## 2012-06-05 MED ORDER — LIDOCAINE HCL (CARDIAC) 20 MG/ML IV SOLN
INTRAVENOUS | Status: DC | PRN
  Administered 2012-06-05: 60 mg via INTRAVENOUS

## 2012-06-05 MED ORDER — CIPROFLOXACIN IN D5W 400 MG/200ML IV SOLN
INTRAVENOUS | Status: AC
  Filled 2012-06-05: qty 200

## 2012-06-05 MED ORDER — DIATRIZOATE MEGLUMINE 30 % UR SOLN
URETHRAL | Status: DC | PRN
  Administered 2012-06-05: 50 mL

## 2012-06-05 MED ORDER — LACTATED RINGERS IV SOLN
INTRAVENOUS | Status: DC
  Administered 2012-06-05: 1000 mL via INTRAVENOUS

## 2012-06-05 MED ORDER — KETOROLAC TROMETHAMINE 30 MG/ML IJ SOLN: 15.0000 mg | Freq: Once | INTRAMUSCULAR | Status: DC | PRN

## 2012-06-05 SURGICAL SUPPLY — 15 items
BAG URO CATCHER STRL LF (DRAPE) ×2 IMPLANT
BASKET ZERO TIP NITINOL 2.4FR (BASKET) ×2 IMPLANT
CATH URET 5FR 28IN OPEN ENDED (CATHETERS) IMPLANT
CLOTH BEACON ORANGE TIMEOUT ST (SAFETY) ×2 IMPLANT
DRAPE CAMERA CLOSED 9X96 (DRAPES) ×2 IMPLANT
GLOVE BIOGEL M STRL SZ7.5 (GLOVE) ×2 IMPLANT
GLOVE SURG SS PI 8.0 STRL IVOR (GLOVE) ×2 IMPLANT
GOWN PREVENTION PLUS XLARGE (GOWN DISPOSABLE) ×2 IMPLANT
GOWN STRL REIN XL XLG (GOWN DISPOSABLE) ×2 IMPLANT
GUIDEWIRE STR DUAL SENSOR (WIRE) ×2 IMPLANT
LASER FIBER DISP (UROLOGICAL SUPPLIES) ×2 IMPLANT
MANIFOLD NEPTUNE II (INSTRUMENTS) ×2 IMPLANT
MARKER SKIN DUAL TIP RULER LAB (MISCELLANEOUS) IMPLANT
PACK CYSTO (CUSTOM PROCEDURE TRAY) ×2 IMPLANT
TUBING CONNECTING 10 (TUBING) ×2 IMPLANT

## 2012-06-05 NOTE — Preoperative (Signed)
Beta Blockers   Reason not to administer Beta Blockers:Not Applicable 

## 2012-06-05 NOTE — Op Note (Signed)
Preoperative diagnosis: Left ureteral calculus Postoperative diagnosis: Left ureteral calculus  Procedure: Cystoscopy, left double-J stent removal, ureteroscopy, holmium laser lithotripsy.   Surgeon: Valetta Fuller M.D.  Anesthesia: Gen.  Indications: Patient initially presented with a 4 mm proximal ureteral calculus. She subsequently developed a concurrent urinary tract infection with increasing pain. Re\re imaging revealed the stone to be in her distal ureter. The patient had placement of a double-J stent as a temporizing maneuver it was treated with antibiotic therapy. She now presents for definitive stone management. Risks and benefits discussed with her and full informed consent obtained.     Technique and findings: Patient was brought the operative where she had sessile induction general anesthesia. She was placed in lithotomy position prepped draped in usual manner. Appropriate surgical timeout was performed. Cystoscopy revealed a left double-J stent to be in good position. The stent was partially extracted and a guidewire placed to the stent. Fluoroscopic guidance was utilized for that portion of the procedure. The left distal ureter was engaged with a rigid 6 French ureteroscope. Approximately a 4 x 6 mm stone was encountered in the distal left ureter a proximally 4-5 cm above the ureterovesical junction. Holmium laser lithotripter was used to break the stone into at least a dozen fragments. The largest pieces were basket extracted. We did not feel that double-J stent was required. Bladder was emptied. Placement was taken to recovery room stable condition.

## 2012-06-05 NOTE — Anesthesia Postprocedure Evaluation (Signed)
  Anesthesia Post-op Note  Patient: Gabriella Martinez  Procedure(s) Performed: Procedure(s) (LRB): URETEROSCOPY (Left) HOLMIUM LASER APPLICATION (Left) CYSTOSCOPY WITH STENT REMOVAL (Left)  Patient Location: PACU  Anesthesia Type: General  Level of Consciousness: awake and alert   Airway and Oxygen Therapy: Patient Spontanous Breathing  Post-op Pain: mild  Post-op Assessment: Post-op Vital signs reviewed, Patient's Cardiovascular Status Stable, Respiratory Function Stable, Patent Airway and No signs of Nausea or vomiting  Post-op Vital Signs: stable  Complications: No apparent anesthesia complications

## 2012-06-05 NOTE — Progress Notes (Signed)
PAS hose removed. Teach back method done

## 2012-06-05 NOTE — Discharge Instructions (Signed)

## 2012-06-05 NOTE — Transfer of Care (Signed)
Immediate Anesthesia Transfer of Care Note  Patient: Gabriella Martinez  Procedure(s) Performed: Procedure(s) (LRB): URETEROSCOPY (Left) HOLMIUM LASER APPLICATION (Left) CYSTOSCOPY WITH STENT REMOVAL (Left)  Patient Location: PACU  Anesthesia Type: General  Level of Consciousness: awake, alert , oriented and patient cooperative  Airway & Oxygen Therapy: Patient Spontanous Breathing and Patient connected to face mask oxygen  Post-op Assessment: Report given to PACU RN, Post -op Vital signs reviewed and stable and Patient moving all extremities  Post vital signs: Reviewed and stable  Complications: No apparent anesthesia complications

## 2012-06-05 NOTE — Interval H&P Note (Signed)
History and Physical Interval Note:  06/05/2012 7:11 AM  Gabriella Martinez  has presented today for surgery, with the diagnosis of LEFT URETERAL CALCULUS  The various methods of treatment have been discussed with the patient and family. After consideration of risks, benefits and other options for treatment, the patient has consented to  Procedure(s) (LRB): URETEROSCOPY (Left) HOLMIUM LASER APPLICATION (Left) CYSTOSCOPY WITH STENT REMOVAL (Left) as a surgical intervention .  The patient's history has been reviewed, patient examined, no change in status, stable for surgery.  I have reviewed the patients' chart and labs.  Questions were answered to the patient's satisfaction.     [name] S  Patient had placement of JJ stent and treatment of UTI with antibiotics. She now presents for definitive stone Mx.

## 2012-06-05 NOTE — Anesthesia Preprocedure Evaluation (Addendum)
Anesthesia Evaluation  Patient identified by MRN, date of birth, ID band Patient awake    Reviewed: Allergy & Precautions, H&P , NPO status , Patient's Chart, lab work & pertinent test results  Airway Mallampati: II TM Distance: <3 FB Neck ROM: Full    Dental No notable dental hx.    Pulmonary neg pulmonary ROS,  breath sounds clear to auscultation  Pulmonary exam normal       Cardiovascular negative cardio ROS  Rhythm:Regular Rate:Normal     Neuro/Psych negative neurological ROS  negative psych ROS   GI/Hepatic negative GI ROS, Neg liver ROS,   Endo/Other  negative endocrine ROS  Renal/GU negative Renal ROS  negative genitourinary   Musculoskeletal negative musculoskeletal ROS (+)   Abdominal   Peds negative pediatric ROS (+)  Hematology negative hematology ROS (+)   Anesthesia Other Findings   Reproductive/Obstetrics negative OB ROS                           Anesthesia Physical Anesthesia Plan  ASA: I  Anesthesia Plan: General   Post-op Pain Management:    Induction: Intravenous  Airway Management Planned: LMA  Additional Equipment:   Intra-op Plan:   Post-operative Plan:   Informed Consent: I have reviewed the patients History and Physical, chart, labs and discussed the procedure including the risks, benefits and alternatives for the proposed anesthesia with the patient or authorized representative who has indicated his/her understanding and acceptance.   Dental advisory given  Plan Discussed with: CRNA  Anesthesia Plan Comments:         Anesthesia Quick Evaluation  

## 2012-06-05 NOTE — H&P (View-Only) (Signed)
Gabriella Martinez is an 31 y.o. female.   Chief Complaint: Left ureteral calculus with chills and abnormal urinalysis HPI: Patient presented originally 6 weeks ago with left flank pain was diagnosed with a 3 mm proximal left ureteral calculus. She's continued intermittently have pain over the last 6 weeks. She reports several days of chills and questionable fever. She presented back to the emergency room where she was noted to have an elevated white blood cell count 13,000. Urinalysis suspicious for ongoing bacterial cystitis. We were consulted for assessment.  Past Medical History  Diagnosis Date  . Kidney stones     No past surgical history on file.  No family history on file. Social History:  reports that she has been smoking.  She does not have any smokeless tobacco history on file. She reports that she drinks alcohol. She reports that she does not use illicit drugs.  Allergies: No Known Allergies   (Not in a hospital admission)  Results for orders placed during the hospital encounter of 05/18/12 (from the past 48 hour(s))  URINALYSIS, ROUTINE W REFLEX MICROSCOPIC     Status: Abnormal   Collection Time   05/18/12 10:35 AM      Component Value Range Comment   Color, Urine YELLOW  YELLOW    APPearance CLOUDY (*) CLEAR    Specific Gravity, Urine 1.029  1.005 - 1.030    pH 6.0  5.0 - 8.0    Glucose, UA 100 (*) NEGATIVE mg/dL    Hgb urine dipstick MODERATE (*) NEGATIVE    Bilirubin Urine NEGATIVE  NEGATIVE    Ketones, ur TRACE (*) NEGATIVE mg/dL    Protein, ur 578 (*) NEGATIVE mg/dL    Urobilinogen, UA 1.0  0.0 - 1.0 mg/dL    Nitrite NEGATIVE  NEGATIVE    Leukocytes, UA MODERATE (*) NEGATIVE   PREGNANCY, URINE     Status: Normal   Collection Time   05/18/12 10:35 AM      Component Value Range Comment   Preg Test, Ur NEGATIVE  NEGATIVE   URINE MICROSCOPIC-ADD ON     Status: Abnormal   Collection Time   05/18/12 10:35 AM      Component Value Range Comment   Squamous Epithelial /  LPF FEW (*) RARE    WBC, UA 11-20  <3 WBC/hpf    RBC / HPF 7-10  <3 RBC/hpf    Bacteria, UA MANY (*) RARE    Urine-Other MUCOUS PRESENT     CBC     Status: Abnormal   Collection Time   05/18/12 11:10 AM      Component Value Range Comment   WBC 13.9 (*) 4.0 - 10.5 K/uL    RBC 3.79 (*) 3.87 - 5.11 MIL/uL    Hemoglobin 11.4 (*) 12.0 - 15.0 g/dL    HCT 46.9 (*) 62.9 - 46.0 %    MCV 90.2  78.0 - 100.0 fL    MCH 30.1  26.0 - 34.0 pg    MCHC 33.3  30.0 - 36.0 g/dL    RDW 52.8  41.3 - 24.4 %    Platelets 174  150 - 400 K/uL   DIFFERENTIAL     Status: Abnormal   Collection Time   05/18/12 11:10 AM      Component Value Range Comment   Neutrophils Relative 88 (*) 43 - 77 %    Lymphocytes Relative 6 (*) 12 - 46 %    Monocytes Relative 6  3 - 12 %  Eosinophils Relative 0  0 - 5 %    Basophils Relative 0  0 - 1 %    Neutro Abs 12.3 (*) 1.7 - 7.7 K/uL    Lymphs Abs 0.8  0.7 - 4.0 K/uL    Monocytes Absolute 0.8  0.1 - 1.0 K/uL    Eosinophils Absolute 0.0  0.0 - 0.7 K/uL    Basophils Absolute 0.0  0.0 - 0.1 K/uL    WBC Morphology TOXIC GRANULATION     COMPREHENSIVE METABOLIC PANEL     Status: Abnormal   Collection Time   05/18/12 11:10 AM      Component Value Range Comment   Sodium 136  135 - 145 mEq/L    Potassium 3.4 (*) 3.5 - 5.1 mEq/L    Chloride 103  96 - 112 mEq/L    CO2 22  19 - 32 mEq/L    Glucose, Bld 130 (*) 70 - 99 mg/dL    BUN 11  6 - 23 mg/dL    Creatinine, Ser 8.11  0.50 - 1.10 mg/dL    Calcium 9.2  8.4 - 91.4 mg/dL    Total Protein 6.8  6.0 - 8.3 g/dL    Albumin 3.3 (*) 3.5 - 5.2 g/dL    AST 19  0 - 37 U/L    ALT 16  0 - 35 U/L    Alkaline Phosphatase 71  39 - 117 U/L    Total Bilirubin 0.4  0.3 - 1.2 mg/dL    GFR calc non Af Amer 85 (*) >90 mL/min    GFR calc Af Amer >90  >90 mL/min    Ct Abdomen Pelvis Wo Contrast  05/18/2012  *RADIOLOGY REPORT*  Clinical Data: Left-sided flank pain, fever, dysuria.  History of proximal left ureteral calculus on prior CT  03/30/2012  CT ABDOMEN AND PELVIS WITHOUT CONTRAST  Technique:  Multidetector CT imaging of the abdomen and pelvis was performed following the standard protocol without intravenous contrast.  Comparison: CT 03/30/2012, ultrasound 05/04/2012  Findings: Lung bases are clear.  The previously seen left ureteral calculus measuring 4 mm has passed distally to the level of the distal ureter, image 58.  There is mild left intrarenal collecting system fullness, decreased from previously, but new left perinephric stranding.  No other radiopaque left renal or ureteral calculus is identified.  1 mm nonobstructing right lower renal pole calculi again noted  Unenhanced gallbladder, adrenal glands, and pancreas are normal. Visualized aspects of liver and spleen are normal.  Appendix is normal.  No bowel wall thickening or focal segmental dilatation.  Uterus and ovaries are normal.  No pelvic, retroperitoneal, or abdominal lymphadenopathy.  No free air.  No new osseous abnormality.  IMPRESSION: Interval distal passage of previously seen 4 mm left ureteral calculus to the level of the anatomic pelvis.  Mild fullness of the left intrarenal collecting system and trace left perinephric stranding.  Stable nonobstructing 1 mm right lower renal pole calculi.  Original Report Authenticated By: Harrel Lemon, M.D.    Review of Systems - Negative except chills, abdominal pain, flank pain, nausea.  Blood pressure 93/63, pulse 86, temperature 98 F (36.7 C), resp. rate 20, last menstrual period 04/13/2012, SpO2 95.00%. General appearance: alert, cooperative and no distress Neck: no adenopathy and no JVD Resp: clear to auscultation bilaterally Cardio: regular rate and rhythm GI: soft, non-tender; bowel sounds normal; no masses,  no organomegaly Extremities: extremities normal, atraumatic, no cyanosis or edema Skin: Skin color, texture, turgor normal.  No rashes or lesions Neurologic: Grossly normal  Assessment/Plan Vision  has a distal 3-4 mm ureteral calculus. She has had this stone presumptively now for 6 weeks plus. She does have potentially evidence of a concurrent urinary tract infection and has a leukocytosis has experienced at least low-grade fever and chills. The safest way to proceed would be semiurgent double-J stent placement to assure adequate drainage of the kidney. I would not recommend definitive stone management with basketing or laser in this situation. We will plan on subsequent definitive ureteroscopy 1-2 weeks status post stent placement. She is received Rocephin and urine culture has been obtained. She will be kept overnight for observation status post the procedure.  Agness Sibrian S 05/18/2012, 1:04 PM

## 2012-06-06 NOTE — OR Nursing (Signed)
Added Procedure time, Jannett Celestine, RN

## 2012-06-07 ENCOUNTER — Encounter (HOSPITAL_COMMUNITY): Payer: Self-pay | Admitting: Urology

## 2012-10-04 ENCOUNTER — Emergency Department (INDEPENDENT_AMBULATORY_CARE_PROVIDER_SITE_OTHER)
Admission: EM | Admit: 2012-10-04 | Discharge: 2012-10-04 | Disposition: A | Discharge status: Home / Self Care | Payer: Self-pay | Source: Home / Self Care | Attending: Family Medicine | Admitting: Family Medicine

## 2012-10-04 ENCOUNTER — Encounter (HOSPITAL_COMMUNITY): Payer: Self-pay

## 2012-10-04 DIAGNOSIS — L089 Local infection of the skin and subcutaneous tissue, unspecified: Secondary | ICD-10-CM

## 2012-10-04 MED ORDER — TRAMADOL HCL 50 MG PO TABS: 50.0000 mg | ORAL_TABLET | Freq: Four times a day (QID) | ORAL | Status: DC | PRN

## 2012-10-04 MED ORDER — SULFAMETHOXAZOLE-TRIMETHOPRIM 800-160 MG PO TABS: 1.0000 | ORAL_TABLET | Freq: Two times a day (BID) | ORAL | Status: DC

## 2012-10-04 NOTE — ED Provider Notes (Signed)
History     CSN: [id number]  Arrival date & time 10/04/12  4098   First MD Initiated Contact with Patient 10/04/12 1025      Chief Complaint  Patient presents with  . Skin Problem    (Consider location/radiation/quality/duration/timing/severity/associated sxs/prior treatment) Patient is a 31 y.o. female presenting with rash. The history is provided by the patient. No language interpreter was used.  Rash  This is a new problem. The current episode started more than 2 days ago. The problem has been gradually worsening. The problem is associated with nothing. There has been no fever. The rash is present on the left foot. The pain is moderate. The pain has been constant since onset. Associated symptoms include itching and pain.  Pt reports she had a similar area in the past that had to be cut open and drained.  Past Medical History  Diagnosis Date  . Kidney stones     Past Surgical History  Procedure Date  . Lithotripsy   . Cystoscopy w/ ureteral stent placement 05/18/2012    Procedure: CYSTOSCOPY WITH RETROGRADE PYELOGRAM/URETERAL STENT PLACEMENT;  Surgeon: Valetta Fuller, MD;  Location: [facility];  Service: Urology;  Laterality: Left;  . Ureteroscopy 06/05/2012    Procedure: URETEROSCOPY;  Surgeon: Valetta Fuller, MD;  Location: [facility];  Service: Urology;  Laterality: Left;      . Cystoscopy w/ ureteral stent removal 06/05/2012    Procedure: CYSTOSCOPY WITH STENT REMOVAL;  Surgeon: Valetta Fuller, MD;  Location: [facility];  Service: Urology;  Laterality: Left;    History reviewed. No pertinent family history.  History  Substance Use Topics  . Smoking status: Current Every Day Smoker  . Smokeless tobacco: Never Used  . Alcohol Use: Yes    OB History    Grav Para Term Preterm Abortions TAB SAB Ect Mult Living                  Review of Systems  Skin: Positive for itching and rash.  All other systems reviewed and are negative.    Allergies  Review of patient's allergies  indicates no known allergies.  Home Medications   Current Outpatient Rx  Name Route Sig Dispense Refill  . ORTHO TRI-CYCLEN LO PO Oral Take 1 tablet by mouth daily. Take every morning    . CIPROFLOXACIN HCL 250 MG PO TABS Oral Take 250 mg by mouth daily.    Marland Kitchen DIPHENHYDRAMINE-ZINC ACETATE 1-0.1 % EX CREA Topical Apply 1 application topically daily as needed. Apply to affected areas on legs    . IBUPROFEN 200 MG PO TABS Oral Take 800 mg by mouth every 6 (six) hours as needed. For pain    . OXYBUTYNIN CHLORIDE 5 MG PO TABS Oral Take 5 mg by mouth every 8 (eight) hours.    . OXYCODONE-ACETAMINOPHEN 5-325 MG PO TABS Oral Take 1 tablet by mouth every 4 (four) hours as needed. For pain    . SULFAMETHOXAZOLE-TRIMETHOPRIM 800-160 MG PO TABS Oral Take 1 tablet by mouth every 12 (twelve) hours. 10 tablet 0  . TRAMADOL HCL 50 MG PO TABS Oral Take 1 tablet (50 mg total) by mouth every 6 (six) hours as needed for pain. 15 tablet 0    BP 133/86  Pulse 78  Temp 98.9 F (37.2 C) (Oral)  Resp 19  SpO2 100%  LMP 09/25/2012  Physical Exam  Nursing note and vitals reviewed. Constitutional: She is oriented to person, place, and time. She appears well-developed and  well-nourished.  Musculoskeletal: Normal range of motion. She exhibits tenderness.       Slightly red, tender area inner foot,  No fluctuance,  No sign of abscess  Neurological: She is alert and oriented to person, place, and time. She has normal reflexes.  Skin: Skin is warm.    ED Course  Procedures (including critical care time)  Labs Reviewed - No data to display No results found.   1. Skin infection       MDM Pt upset when I entered room.   Pt reports angry because she had to wait and because of nurse.   Pt upset because she thinks area needs to be drained.  I advised pt this could be a early infection but that there is no abscess.  This may just be an insect bite or possible area of irritation,   Dr. Alfonse Ras Coll went in to  see pt and examine as pt is unhappy with my opinion.  I will cover with antibiotic and give tramadol for pain.  Pt given primary care referrals.          [name] [place], [place] 10/04/12 1143  [name] [place], [place] 10/04/12 [phone]

## 2012-10-04 NOTE — ED Notes (Signed)
I attempted to speak to the patient for service recovery after she accused the RN, PA, and MD of "not helping her" and "being rude". She stated that she did not want to talk to "anyone in this building". I asked her to tell me what happened and she was very rude and tearful. Stated that she was going to wait in the hallway for her prescription and then she was leaving and also that "she wasn't going to pay her bill because "no one did anything for her.  Dr. Tressia Danas attempted to offer second opinion but the patient would not allow her to ask any questions without interrupting and being rude to her.

## 2012-10-04 NOTE — ED Notes (Signed)
States she has noted irritated, itchy areas on lower leg and on lateral aspect of plantar surface of foot since monday

## 2012-10-05 NOTE — ED Provider Notes (Signed)
Medical screening examination/treatment/procedure(s) were performed by non-physician practitioner and as supervising physician I was immediately available for consultation/collaboration.   MORENO-COLL,Libni Fusaro; MD   Hanaan Gancarz Moreno-Coll, MD 10/05/12 0620 

## 2015-03-18 ENCOUNTER — Encounter (HOSPITAL_COMMUNITY): Payer: Self-pay | Admitting: Emergency Medicine

## 2015-03-18 ENCOUNTER — Inpatient Hospital Stay (HOSPITAL_COMMUNITY)
Admission: EM | Admit: 2015-03-18 | Discharge: 2015-03-20 | DRG: 694 | Disposition: A | Discharge status: Home / Self Care | Payer: Self-pay | Attending: Internal Medicine | Admitting: Internal Medicine

## 2015-03-18 ENCOUNTER — Emergency Department (HOSPITAL_COMMUNITY): Payer: Self-pay

## 2015-03-18 DIAGNOSIS — N390 Urinary tract infection, site not specified: Secondary | ICD-10-CM | POA: Diagnosis present

## 2015-03-18 DIAGNOSIS — I959 Hypotension, unspecified: Secondary | ICD-10-CM

## 2015-03-18 DIAGNOSIS — R319 Hematuria, unspecified: Secondary | ICD-10-CM

## 2015-03-18 DIAGNOSIS — D649 Anemia, unspecified: Secondary | ICD-10-CM | POA: Diagnosis present

## 2015-03-18 DIAGNOSIS — N200 Calculus of kidney: Secondary | ICD-10-CM

## 2015-03-18 DIAGNOSIS — N132 Hydronephrosis with renal and ureteral calculous obstruction: Principal | ICD-10-CM | POA: Diagnosis present

## 2015-03-18 DIAGNOSIS — N201 Calculus of ureter: Secondary | ICD-10-CM

## 2015-03-18 DIAGNOSIS — N179 Acute kidney failure, unspecified: Secondary | ICD-10-CM

## 2015-03-18 DIAGNOSIS — F17210 Nicotine dependence, cigarettes, uncomplicated: Secondary | ICD-10-CM | POA: Diagnosis present

## 2015-03-18 DIAGNOSIS — B952 Enterococcus as the cause of diseases classified elsewhere: Secondary | ICD-10-CM | POA: Diagnosis present

## 2015-03-18 LAB — URINALYSIS, ROUTINE W REFLEX MICROSCOPIC
GLUCOSE, UA: NEGATIVE mg/dL
Ketones, ur: NEGATIVE mg/dL
Nitrite: NEGATIVE
Protein, ur: 100 mg/dL — AB
SPECIFIC GRAVITY, URINE: 1.023 (ref 1.005–1.030)
Urobilinogen, UA: 1 mg/dL (ref 0.0–1.0)
pH: 5.5 (ref 5.0–8.0)

## 2015-03-18 LAB — CBC
HEMATOCRIT: 34 % — AB (ref 36.0–46.0)
HEMOGLOBIN: 11.5 g/dL — AB (ref 12.0–15.0)
MCH: 29.4 pg (ref 26.0–34.0)
MCHC: 33.8 g/dL (ref 30.0–36.0)
MCV: 87 fL (ref 78.0–100.0)
Platelets: 139 10*3/uL — ABNORMAL LOW (ref 150–400)
RBC: 3.91 MIL/uL (ref 3.87–5.11)
RDW: 15.1 % (ref 11.5–15.5)
WBC: 8.4 10*3/uL (ref 4.0–10.5)

## 2015-03-18 LAB — COMPREHENSIVE METABOLIC PANEL
ALT: 23 U/L (ref 0–35)
ANION GAP: 8 (ref 5–15)
AST: 32 U/L (ref 0–37)
Albumin: 3.2 g/dL — ABNORMAL LOW (ref 3.5–5.2)
Alkaline Phosphatase: 82 U/L (ref 39–117)
BILIRUBIN TOTAL: 0.9 mg/dL (ref 0.3–1.2)
BUN: 47 mg/dL — AB (ref 6–23)
CALCIUM: 8.3 mg/dL — AB (ref 8.4–10.5)
CHLORIDE: 107 mmol/L (ref 96–112)
CO2: 22 mmol/L (ref 19–32)
CREATININE: 2.02 mg/dL — AB (ref 0.50–1.10)
GFR calc Af Amer: 36 mL/min — ABNORMAL LOW (ref >90)
GFR, EST NON AFRICAN AMERICAN: 31 mL/min — AB (ref >90)
Glucose, Bld: 107 mg/dL — ABNORMAL HIGH (ref 70–99)
Potassium: 3.4 mmol/L — ABNORMAL LOW (ref 3.5–5.1)
Sodium: 137 mmol/L (ref 135–145)
Total Protein: 6.6 g/dL (ref 6.0–8.3)

## 2015-03-18 LAB — PREGNANCY, URINE: PREG TEST UR: NEGATIVE

## 2015-03-18 LAB — URINE MICROSCOPIC-ADD ON

## 2015-03-18 MED ORDER — DEXTROSE 5 % IV SOLN
1.0000 g | Freq: Once | INTRAVENOUS | Status: AC
  Administered 2015-03-18: 1 g via INTRAVENOUS
  Filled 2015-03-18: qty 10

## 2015-03-18 MED ORDER — HYDROMORPHONE HCL 1 MG/ML IJ SOLN
1.0000 mg | Freq: Once | INTRAMUSCULAR | Status: AC
  Administered 2015-03-18: 1 mg via INTRAVENOUS
  Filled 2015-03-18: qty 1

## 2015-03-18 MED ORDER — SODIUM CHLORIDE 0.9 % IV BOLUS (SEPSIS)
1000.0000 mL | Freq: Once | INTRAVENOUS | Status: AC
  Administered 2015-03-18: 1000 mL via INTRAVENOUS

## 2015-03-18 MED ORDER — SODIUM CHLORIDE 0.9 % IV SOLN
Freq: Once | INTRAVENOUS | Status: AC
  Administered 2015-03-18: 23:00:00 via INTRAVENOUS

## 2015-03-18 NOTE — ED Notes (Signed)
MD at bedside.  [facility]

## 2015-03-18 NOTE — ED Notes (Signed)
Pt ambulated to the BR with steady gait.   

## 2015-03-18 NOTE — ED Provider Notes (Signed)
CSN: [id number]     Arrival date & time 03/18/15  1654 History   First MD Initiated Contact with Patient 03/18/15 1931     Chief Complaint  Patient presents with  . Flank Pain     (Consider location/radiation/quality/duration/timing/severity/associated sxs/prior Treatment) Patient is a 34 y.o. female presenting with abdominal pain.  Abdominal Pain Pain location:  R flank Pain quality: aching   Pain radiates to:  Does not radiate Pain severity:  Severe Onset quality:  Gradual Duration:  2 days (started 5 days ago, but improved.  Then worsed two days ago.  ) Timing:  Constant Progression:  Worsening Chronicity:  Recurrent Context comment:  Seen in the ED at [facility] about 5 days ago and was diagnosed with a 4mm stone at [facility].   Relieved by:  Nothing Worsened by:  Movement and palpation Ineffective treatments: oxycodone, ibuprofen. Associated symptoms: chills, fever (subjective), hematuria and nausea     Past Medical History  Diagnosis Date  . Kidney stones    Past Surgical History  Procedure Laterality Date  . Lithotripsy    . Cystoscopy w/ ureteral stent placement  05/18/2012    Procedure: CYSTOSCOPY WITH RETROGRADE PYELOGRAM/URETERAL STENT PLACEMENT;  Surgeon: Valetta Fuller, MD;  Location: [facility];  Service: Urology;  Laterality: Left;  . Ureteroscopy  06/05/2012    Procedure: URETEROSCOPY;  Surgeon: Valetta Fuller, MD;  Location: [facility];  Service: Urology;  Laterality: Left;      . Cystoscopy w/ ureteral stent removal  06/05/2012    Procedure: CYSTOSCOPY WITH STENT REMOVAL;  Surgeon: Valetta Fuller, MD;  Location: [facility];  Service: Urology;  Laterality: Left;   History reviewed. No pertinent family history. History  Substance Use Topics  . Smoking status: Current Every Day Smoker  . Smokeless tobacco: Never Used  . Alcohol Use: Yes   OB History    No data available     Review of Systems  Constitutional: Positive for fever (subjective) and chills.   Gastrointestinal: Positive for nausea and abdominal pain.  Genitourinary: Positive for hematuria and flank pain.  All other systems reviewed and are negative.     Allergies  Review of patient's allergies indicates no known allergies.  Home Medications   Prior to Admission medications   Medication Sig Start Date End Date Taking? Authorizing Provider  ibuprofen (ADVIL,MOTRIN) 200 MG tablet Take 800 mg by mouth every 6 (six) hours as needed. For pain   Yes Historical Provider, MD  ketorolac (TORADOL) 10 MG tablet Take 10 mg by mouth every 6 (six) hours as needed for moderate pain.   Yes Historical Provider, MD  Multiple Vitamin (MULTIVITAMIN WITH MINERALS) TABS tablet Take 1 tablet by mouth daily.   Yes Historical Provider, MD  Norgestim-Eth Charlott Holler Triphasic (ORTHO TRI-CYCLEN LO PO) Take 1 tablet by mouth daily. Take every morning   Yes Historical Provider, MD  oxybutynin (DITROPAN) 5 MG tablet Take 5 mg by mouth every 8 (eight) hours. 05/19/12 05/19/13  Barron Alvine, MD  oxyCODONE (OXY IR/ROXICODONE) 5 MG immediate release tablet Take 5 mg by mouth every 4 (four) hours as needed for moderate pain or severe pain.    Historical Provider, MD  sulfamethoxazole-trimethoprim (SEPTRA DS) 800-160 MG per tablet Take 1 tablet by mouth every 12 (twelve) hours. Patient not taking: Reported on 03/18/2015 10/04/12   Elson Areas, PA-C  traMADol (ULTRAM) 50 MG tablet Take 1 tablet (50 mg total) by mouth every 6 (six) hours as needed for pain.  Patient not taking: Reported on 03/18/2015 10/04/12   Elson Areas, PA-C   BP 85/58 mmHg  Pulse 94  Temp(Src) 97.4 F (36.3 C) (Oral)  Resp 18  SpO2 98% Physical Exam  Constitutional: She is oriented to person, place, and time. She appears well-developed and well-nourished. No distress.  HENT:  Head: Normocephalic and atraumatic.  Mouth/Throat: Oropharynx is clear and moist.  Eyes: Conjunctivae are normal. Pupils are equal, round, and reactive to light. No  scleral icterus.  Neck: Neck supple.  Cardiovascular: Normal rate, regular rhythm, normal heart sounds and intact distal pulses.   No murmur heard. Pulmonary/Chest: Effort normal and breath sounds normal. No stridor. No respiratory distress. She has no rales.  Abdominal: Soft. Bowel sounds are normal. She exhibits no distension. There is tenderness in the right upper quadrant and right lower quadrant. There is CVA tenderness (right).  Musculoskeletal: Normal range of motion.  Neurological: She is alert and oriented to person, place, and time.  Skin: Skin is warm and dry. No rash noted.  Psychiatric: She has a normal mood and affect. Her behavior is normal.  Nursing note and vitals reviewed.   ED Course  Procedures (including critical care time) Labs Review Labs Reviewed  URINALYSIS, ROUTINE W REFLEX MICROSCOPIC - Abnormal; Notable for the following:    Color, Urine AMBER (*)    APPearance TURBID (*)    Hgb urine dipstick LARGE (*)    Bilirubin Urine MODERATE (*)    Protein, ur 100 (*)    Leukocytes, UA LARGE (*)    All other components within normal limits  CBC - Abnormal; Notable for the following:    Hemoglobin 11.5 (*)    HCT 34.0 (*)    Platelets 139 (*)    All other components within normal limits  COMPREHENSIVE METABOLIC PANEL - Abnormal; Notable for the following:    Potassium 3.4 (*)    Glucose, Bld 107 (*)    BUN 47 (*)    Creatinine, Ser 2.02 (*)    Calcium 8.3 (*)    Albumin 3.2 (*)    GFR calc non Af Amer 31 (*)    GFR calc Af Amer 36 (*)    All other components within normal limits  URINE MICROSCOPIC-ADD ON - Abnormal; Notable for the following:    Squamous Epithelial / LPF FEW (*)    Casts GRANULAR CAST (*)    All other components within normal limits  URINE CULTURE  PREGNANCY, URINE    Imaging Review US Renal  03/18/2015   CLINICAL DATA:  Right flank pain since 03/14/2015.  EXAM: RENAL/URINARY TRACT ULTRASOUND COMPLETE  COMPARISON:  CT 03/15/2015   FINDINGS: Right Kidney:  Length: 12.8 cm. There is mild right hydronephrosis. Minimal prominence of the right proximal ureter. The 4 mm stone in the mid proximal ureter on prior CT is not seen. Echogenicity within normal limits. No mass visualized.  Left Kidney:  Length: 11.6 cm. Echogenicity within normal limits. No mass or hydronephrosis visualized.  Bladder:  Appears normal for degree of bladder distention. Left ureteral jets are demonstrated. Only intermittent right ureteral jet is seen.  IMPRESSION: Mild right hydronephrosis, similar to prior CT. The 4 mm stone on prior CT in the right ureter is not seen sonographically.   Electronically Signed   By: Rubye Oaks M.D.   On: 03/18/2015 22:28     EKG Interpretation None      MDM   Final diagnoses:  Right ureteral stone  Urinary  tract infection with hematuria, site unspecified    34 yo female recently diagnosed with 4mm UPJ stone [facility].  Pain initially improved, but worsened for past several days.  Labs demonstrate worsening renal function (Cr 0.9 on 03/15/15) and UA with TNTC WBCs and RBCs.  US demonstrates right sided hydro.  BPs have been soft after dilaudid, but given constellation of findings I'm concerned about septic stone.  Have ordered Ceftriaxone.  Discussed with Dr. [facility] (Urology) who will be in to evaluate.  Will also consult medicine for admission.      Blake DivineJohn Zyier Dykema, MD 03/19/15 [phone]

## 2015-03-18 NOTE — ED Notes (Signed)
Pt reports R flank pain, was seen at [facility] Friday for same and was told that she has a kidney stone.  States she is not able to f/u with a Urologist d/t no insurance.  Pt became upset with this nurse because this nurse asked her what size her kidney stone is  And she does not know.  Pt states "I don't know!  I have kidney stones and I am in pain."  Pt reports she was rx "20 roxys" and was instructed to take 1 Q 6 hours and states that she took 3 each time.  She states that it did not help with the pain.  Pt reports she saw a Urologist in the ED years ago and is expecting to see one here tonight.

## 2015-03-18 NOTE — ED Notes (Addendum)
Went in to check on pt d/t hypotension.  Pt states "I don't know why my back is still hurting after the pain meds.  But what am I waiting on anyway?"  Reason for delay explained to pt.  [place] EDP made aware re pt's unchanged pain and hypotension.

## 2015-03-18 NOTE — ED Provider Notes (Cosign Needed)
CSN: [id number]     Arrival date & time 03/18/15  1654 History   None    Chief Complaint  Patient presents with  . Flank Pain     (Consider location/radiation/quality/duration/timing/severity/associated sxs/prior Treatment) Patient is a 34 y.o. female presenting with flank pain. The history is provided by the patient.  Flank Pain This is a new problem. The current episode started in the past 7 days. The problem occurs constantly. The problem has been gradually worsening. Associated symptoms include abdominal pain, a fever, nausea and urinary symptoms. Nothing aggravates the symptoms. She has tried oral narcotics for the symptoms. The treatment provided no relief.    Past Medical History  Diagnosis Date  . Kidney stones    Past Surgical History  Procedure Laterality Date  . Lithotripsy    . Cystoscopy w/ ureteral stent placement  05/18/2012    Procedure: CYSTOSCOPY WITH RETROGRADE PYELOGRAM/URETERAL STENT PLACEMENT;  Surgeon: Valetta Fuller, MD;  Location: [facility];  Service: Urology;  Laterality: Left;  . Ureteroscopy  06/05/2012    Procedure: URETEROSCOPY;  Surgeon: Valetta Fuller, MD;  Location: [facility];  Service: Urology;  Laterality: Left;      . Cystoscopy w/ ureteral stent removal  06/05/2012    Procedure: CYSTOSCOPY WITH STENT REMOVAL;  Surgeon: Valetta Fuller, MD;  Location: [facility];  Service: Urology;  Laterality: Left;   History reviewed. No pertinent family history. History  Substance Use Topics  . Smoking status: Current Every Day Smoker  . Smokeless tobacco: Never Used  . Alcohol Use: Yes   OB History    No data available     Review of Systems  Constitutional: Positive for fever.  Gastrointestinal: Positive for nausea and abdominal pain.  Genitourinary: Positive for urgency, frequency, hematuria, flank pain, decreased urine volume and difficulty urinating. Negative for vaginal bleeding and vaginal discharge.      Allergies  Review of patient's allergies  indicates no known allergies.  Home Medications   Prior to Admission medications   Medication Sig Start Date End Date Taking? Authorizing Provider  ciprofloxacin (CIPRO) 250 MG tablet Take 250 mg by mouth daily.    Historical Provider, MD  diphenhydrAMINE-zinc acetate (BENADRYL) cream Apply 1 application topically daily as needed. Apply to affected areas on legs    Historical Provider, MD  ibuprofen (ADVIL,MOTRIN) 200 MG tablet Take 800 mg by mouth every 6 (six) hours as needed. For pain    Historical Provider, MD  Norgestim-Eth Charlott Holler Triphasic (ORTHO TRI-CYCLEN LO PO) Take 1 tablet by mouth daily. Take every morning    Historical Provider, MD  oxybutynin (DITROPAN) 5 MG tablet Take 5 mg by mouth every 8 (eight) hours. 05/19/12 05/19/13  Barron Alvine, MD  oxyCODONE-acetaminophen (PERCOCET) 5-325 MG per tablet Take 1 tablet by mouth every 4 (four) hours as needed. For pain    Historical Provider, MD  sulfamethoxazole-trimethoprim (SEPTRA DS) 800-160 MG per tablet Take 1 tablet by mouth every 12 (twelve) hours. 10/04/12   Elson Areas, PA-C  traMADol (ULTRAM) 50 MG tablet Take 1 tablet (50 mg total) by mouth every 6 (six) hours as needed for pain. 10/04/12   [name] Sofia, PA-C   BP 101/60 mmHg  Pulse 91  Temp(Src) 97.4 F (36.3 C) (Oral)  Resp 18  SpO2 100% Physical Exam  ED Course  Procedures (including critical care time) Labs Review Labs Reviewed  CBC - Abnormal; Notable for the following:    Hemoglobin 11.5 (*)    HCT  34.0 (*)    Platelets 139 (*)    All other components within normal limits  COMPREHENSIVE METABOLIC PANEL - Abnormal; Notable for the following:    Potassium 3.4 (*)    Glucose, Bld 107 (*)    BUN 47 (*)    Creatinine, Ser 2.02 (*)    Calcium 8.3 (*)    Albumin 3.2 (*)    GFR calc non Af Amer 31 (*)    GFR calc Af Amer 36 (*)    All other components within normal limits  URINALYSIS, ROUTINE W REFLEX MICROSCOPIC    Imaging Review No results found.    EKG Interpretation None      MDM   Final diagnoses:  None    Kidney stone    Joni ReiningRonald K Smith, PA-C 04/09/15 1749

## 2015-03-18 NOTE — ED Notes (Signed)
Pt states Friday she began having right sided flank pain. [facility] and was diagnosed with kidney stones, they gave her pain pills and told her to pass it. Since then she's been having increasing pain and has been unable to pass the stone.

## 2015-03-18 NOTE — ED Notes (Signed)
Ultrasound at beside.  

## 2015-03-19 ENCOUNTER — Encounter (HOSPITAL_COMMUNITY): Payer: Self-pay | Admitting: *Deleted

## 2015-03-19 ENCOUNTER — Encounter (HOSPITAL_COMMUNITY)
Admission: EM | Disposition: A | Discharge status: Home / Self Care | Payer: Self-pay | Source: Home / Self Care | Attending: Internal Medicine

## 2015-03-19 ENCOUNTER — Inpatient Hospital Stay (HOSPITAL_COMMUNITY): Payer: Self-pay | Admitting: Certified Registered"

## 2015-03-19 DIAGNOSIS — N200 Calculus of kidney: Secondary | ICD-10-CM

## 2015-03-19 DIAGNOSIS — I959 Hypotension, unspecified: Secondary | ICD-10-CM

## 2015-03-19 DIAGNOSIS — N179 Acute kidney failure, unspecified: Secondary | ICD-10-CM

## 2015-03-19 HISTORY — PX: CYSTOSCOPY W/ RETROGRADES: SHX1426

## 2015-03-19 LAB — CBC
HCT: 31.5 % — ABNORMAL LOW (ref 36.0–46.0)
HEMOGLOBIN: 10.6 g/dL — AB (ref 12.0–15.0)
MCH: 29.4 pg (ref 26.0–34.0)
MCHC: 33.7 g/dL (ref 30.0–36.0)
MCV: 87.3 fL (ref 78.0–100.0)
Platelets: 134 10*3/uL — ABNORMAL LOW (ref 150–400)
RBC: 3.61 MIL/uL — ABNORMAL LOW (ref 3.87–5.11)
RDW: 15.3 % (ref 11.5–15.5)
WBC: 6.9 10*3/uL (ref 4.0–10.5)

## 2015-03-19 LAB — COMPREHENSIVE METABOLIC PANEL
ALBUMIN: 2.4 g/dL — AB (ref 3.5–5.2)
ALT: 23 U/L (ref 0–35)
ANION GAP: 7 (ref 5–15)
AST: 33 U/L (ref 0–37)
Alkaline Phosphatase: 101 U/L (ref 39–117)
BUN: 39 mg/dL — ABNORMAL HIGH (ref 6–23)
CALCIUM: 7.7 mg/dL — AB (ref 8.4–10.5)
CO2: 19 mmol/L (ref 19–32)
Chloride: 111 mmol/L (ref 96–112)
Creatinine, Ser: 1.61 mg/dL — ABNORMAL HIGH (ref 0.50–1.10)
GFR calc Af Amer: 48 mL/min — ABNORMAL LOW (ref >90)
GFR calc non Af Amer: 41 mL/min — ABNORMAL LOW (ref >90)
Glucose, Bld: 145 mg/dL — ABNORMAL HIGH (ref 70–99)
Potassium: 3.8 mmol/L (ref 3.5–5.1)
SODIUM: 137 mmol/L (ref 135–145)
TOTAL PROTEIN: 5.2 g/dL — AB (ref 6.0–8.3)
Total Bilirubin: 1.4 mg/dL — ABNORMAL HIGH (ref 0.3–1.2)

## 2015-03-19 LAB — APTT: APTT: 34 s (ref 24–37)

## 2015-03-19 LAB — PROTIME-INR
INR: 1.14 (ref 0.00–1.49)
Prothrombin Time: 14.7 seconds (ref 11.6–15.2)

## 2015-03-19 LAB — GLUCOSE, CAPILLARY: GLUCOSE-CAPILLARY: 205 mg/dL — AB (ref 70–99)

## 2015-03-19 LAB — TSH: TSH: 2.221 u[IU]/mL (ref 0.350–4.500)

## 2015-03-19 SURGERY — CYSTOSCOPY, WITH RETROGRADE PYELOGRAM
Anesthesia: General | Site: Ureter | Laterality: Right

## 2015-03-19 MED ORDER — SODIUM CHLORIDE 0.9 % IV SOLN
INTRAVENOUS | Status: DC
  Administered 2015-03-19 (×2): via INTRAVENOUS

## 2015-03-19 MED ORDER — ENSURE ENLIVE PO LIQD
237.0000 mL | Freq: Two times a day (BID) | ORAL | Status: DC
  Administered 2015-03-19: 237 mL via ORAL

## 2015-03-19 MED ORDER — FENTANYL CITRATE 0.05 MG/ML IJ SOLN
INTRAMUSCULAR | Status: DC | PRN
  Administered 2015-03-19: 50 ug via INTRAVENOUS

## 2015-03-19 MED ORDER — PHENAZOPYRIDINE HCL 200 MG PO TABS
200.0000 mg | ORAL_TABLET | Freq: Three times a day (TID) | ORAL | Status: DC | PRN
  Filled 2015-03-19: qty 1

## 2015-03-19 MED ORDER — NORGESTIM-ETH ESTRAD TRIPHASIC 0.18/0.215/0.25 MG-25 MCG PO TABS: 1.0000 | ORAL_TABLET | Freq: Every day | ORAL | Status: DC

## 2015-03-19 MED ORDER — PNEUMOCOCCAL VAC POLYVALENT 25 MCG/0.5ML IJ INJ
0.5000 mL | INJECTION | INTRAMUSCULAR | Status: DC
  Filled 2015-03-19 (×2): qty 0.5

## 2015-03-19 MED ORDER — DEXAMETHASONE SODIUM PHOSPHATE 10 MG/ML IJ SOLN
INTRAMUSCULAR | Status: AC
  Filled 2015-03-19: qty 1

## 2015-03-19 MED ORDER — METOCLOPRAMIDE HCL 5 MG/ML IJ SOLN
INTRAMUSCULAR | Status: AC
  Filled 2015-03-19: qty 2

## 2015-03-19 MED ORDER — MORPHINE SULFATE 2 MG/ML IJ SOLN
2.0000 mg | INTRAMUSCULAR | Status: DC | PRN
  Administered 2015-03-19 – 2015-03-20 (×4): 2 mg via INTRAVENOUS
  Filled 2015-03-19 (×4): qty 1

## 2015-03-19 MED ORDER — CEFAZOLIN SODIUM-DEXTROSE 2-3 GM-% IV SOLR: 2.0000 g | INTRAVENOUS | Status: DC

## 2015-03-19 MED ORDER — ACETAMINOPHEN 325 MG PO TABS
650.0000 mg | ORAL_TABLET | Freq: Four times a day (QID) | ORAL | Status: DC | PRN
Start: 2015-03-19 — End: 2015-03-20

## 2015-03-19 MED ORDER — OXYCODONE HCL 5 MG PO TABS
5.0000 mg | ORAL_TABLET | ORAL | Status: DC | PRN
  Administered 2015-03-19 – 2015-03-20 (×4): 5 mg via ORAL
  Filled 2015-03-19 (×4): qty 1

## 2015-03-19 MED ORDER — LIDOCAINE HCL (CARDIAC) 20 MG/ML IV SOLN
INTRAVENOUS | Status: AC
Start: 2015-03-19 — End: 2015-03-19
  Filled 2015-03-19: qty 5

## 2015-03-19 MED ORDER — CEFTRIAXONE SODIUM IN DEXTROSE 20 MG/ML IV SOLN
1.0000 g | INTRAVENOUS | Status: DC
  Administered 2015-03-19: 1 g via INTRAVENOUS
  Filled 2015-03-19: qty 50

## 2015-03-19 MED ORDER — ONDANSETRON HCL 4 MG/2ML IJ SOLN: 4.0000 mg | Freq: Four times a day (QID) | INTRAMUSCULAR | Status: DC | PRN

## 2015-03-19 MED ORDER — ACETAMINOPHEN 650 MG RE SUPP: 650.0000 mg | Freq: Four times a day (QID) | RECTAL | Status: DC | PRN

## 2015-03-19 MED ORDER — CIPROFLOXACIN IN D5W 400 MG/200ML IV SOLN
INTRAVENOUS | Status: AC
  Filled 2015-03-19: qty 200

## 2015-03-19 MED ORDER — PHENYLEPHRINE HCL 10 MG/ML IJ SOLN
INTRAMUSCULAR | Status: DC | PRN
  Administered 2015-03-19: 80 ug via INTRAVENOUS

## 2015-03-19 MED ORDER — METOCLOPRAMIDE HCL 5 MG/ML IJ SOLN
INTRAMUSCULAR | Status: DC | PRN
  Administered 2015-03-19: 10 mg via INTRAVENOUS

## 2015-03-19 MED ORDER — DEXTROSE-NACL 5-0.45 % IV SOLN
INTRAVENOUS | Status: DC
  Administered 2015-03-19: 04:00:00 via INTRAVENOUS

## 2015-03-19 MED ORDER — FENTANYL CITRATE 0.05 MG/ML IJ SOLN
INTRAMUSCULAR | Status: AC
  Filled 2015-03-19: qty 2

## 2015-03-19 MED ORDER — ONDANSETRON HCL 4 MG/2ML IJ SOLN
INTRAMUSCULAR | Status: AC
  Filled 2015-03-19: qty 2

## 2015-03-19 MED ORDER — CIPROFLOXACIN IN D5W 400 MG/200ML IV SOLN
INTRAVENOUS | Status: DC | PRN
  Administered 2015-03-19: 400 mg via INTRAVENOUS

## 2015-03-19 MED ORDER — ONDANSETRON HCL 4 MG PO TABS: 4.0000 mg | ORAL_TABLET | Freq: Four times a day (QID) | ORAL | Status: DC | PRN

## 2015-03-19 MED ORDER — DEXAMETHASONE SODIUM PHOSPHATE 10 MG/ML IJ SOLN
INTRAMUSCULAR | Status: DC | PRN
  Administered 2015-03-19: 10 mg via INTRAVENOUS

## 2015-03-19 MED ORDER — OXYCODONE-ACETAMINOPHEN 5-325 MG PO TABS
1.0000 | ORAL_TABLET | ORAL | Status: DC | PRN
  Administered 2015-03-19 – 2015-03-20 (×3): 1 via ORAL
  Filled 2015-03-19 (×3): qty 1

## 2015-03-19 MED ORDER — KETOROLAC TROMETHAMINE 30 MG/ML IJ SOLN
30.0000 mg | INTRAMUSCULAR | Status: DC
  Administered 2015-03-19 – 2015-03-20 (×2): 30 mg via INTRAVENOUS
  Filled 2015-03-19 (×2): qty 1

## 2015-03-19 MED ORDER — ONDANSETRON HCL 4 MG/2ML IJ SOLN
INTRAMUSCULAR | Status: DC | PRN
  Administered 2015-03-19: 4 mg via INTRAVENOUS

## 2015-03-19 MED ORDER — FOLIC ACID 1 MG PO TABS
1.0000 mg | ORAL_TABLET | Freq: Every day | ORAL | Status: DC
  Administered 2015-03-19 – 2015-03-20 (×2): 1 mg via ORAL
  Filled 2015-03-19 (×2): qty 1

## 2015-03-19 MED ORDER — PROPOFOL 10 MG/ML IV BOLUS
INTRAVENOUS | Status: DC | PRN
  Administered 2015-03-19: 200 mg via INTRAVENOUS

## 2015-03-19 MED ORDER — BELLADONNA ALKALOIDS-OPIUM 16.2-60 MG RE SUPP
RECTAL | Status: AC
  Filled 2015-03-19: qty 1

## 2015-03-19 MED ORDER — MIDAZOLAM HCL 2 MG/2ML IJ SOLN
INTRAMUSCULAR | Status: AC
  Filled 2015-03-19: qty 2

## 2015-03-19 MED ORDER — PROPOFOL 10 MG/ML IV BOLUS
INTRAVENOUS | Status: AC
  Filled 2015-03-19: qty 20

## 2015-03-19 MED ORDER — HEPARIN SODIUM (PORCINE) 5000 UNIT/ML IJ SOLN
5000.0000 [IU] | Freq: Three times a day (TID) | INTRAMUSCULAR | Status: DC
  Administered 2015-03-19 (×2): 5000 [IU] via SUBCUTANEOUS
  Filled 2015-03-19 (×4): qty 1

## 2015-03-19 MED ORDER — LACTATED RINGERS IV SOLN
INTRAVENOUS | Status: DC | PRN
  Administered 2015-03-19: 01:00:00 via INTRAVENOUS

## 2015-03-19 MED ORDER — LIP MEDEX EX OINT
TOPICAL_OINTMENT | CUTANEOUS | Status: AC
  Filled 2015-03-19: qty 7

## 2015-03-19 MED ORDER — SODIUM CHLORIDE 0.9 % IV SOLN: INTRAVENOUS | Status: DC

## 2015-03-19 MED ORDER — VITAMIN B-1 100 MG PO TABS
100.0000 mg | ORAL_TABLET | Freq: Every day | ORAL | Status: DC
  Administered 2015-03-19 – 2015-03-20 (×2): 100 mg via ORAL
  Filled 2015-03-19 (×2): qty 1

## 2015-03-19 MED ORDER — MIDAZOLAM HCL 5 MG/5ML IJ SOLN
INTRAMUSCULAR | Status: DC | PRN
  Administered 2015-03-19: 2 mg via INTRAVENOUS

## 2015-03-19 MED ORDER — ADULT MULTIVITAMIN W/MINERALS CH
1.0000 | ORAL_TABLET | Freq: Every day | ORAL | Status: DC
  Administered 2015-03-19 – 2015-03-20 (×2): 1 via ORAL
  Filled 2015-03-19 (×2): qty 1

## 2015-03-19 SURGICAL SUPPLY — 3 items
GUIDEWIRE STR DUAL SENSOR (WIRE) ×3 IMPLANT
PACK CYSTO (CUSTOM PROCEDURE TRAY) ×3 IMPLANT
STENT POLARIS 5FRX24 (STENTS) ×3 IMPLANT

## 2015-03-19 NOTE — Progress Notes (Addendum)
All timed events in record, inaccurate-case done during down time.  Times placed and note written by Ardeen JourdainKim Shalena Ezzell RN

## 2015-03-19 NOTE — Care Management Note (Signed)
    Page 1 of 1   03/19/2015     4:11:27 PM CARE MANAGEMENT NOTE 03/19/2015  Patient:  Gabriella Martinez,Gabriella Martinez   Account Number:  [id number]  Date Initiated:  03/19/2015  Documentation initiated by:  Lanier ClamMAHABIR,Lydell Moga  Subjective/Objective Assessment:   34 y/o f admitted w/aki.     Action/Plan:   From home.   Anticipated DC Date:  03/21/2015   Anticipated DC Plan:  HOME/SELF CARE      DC Planning Services  CM consult      Choice offered to / List presented to:             Status of service:  In process, will continue to follow Medicare Important Message given?   (If response is "NO", the following Medicare IM given date fields will be blank) Date Medicare IM given:   Medicare IM given by:   Date Additional Medicare IM given:   Additional Medicare IM given by:    Discharge Disposition:    Per UR Regulation:  Reviewed for med. necessity/level of care/duration of stay  If discussed at Long Length of Stay Meetings, dates discussed:    Comments:  03/19/15 Lanier ClamKathy Glenyce Randle RN BSN NCM [phone] No anticipated d/c needs.

## 2015-03-19 NOTE — Progress Notes (Signed)
Patient Demographics  Gabriella Martinez, is a 34 y.o. female, DOB - 1981-02-26, ZOX:[id number]  Admit date - 03/18/2015   Admitting Physician Jethro Bolus, MD  Outpatient Primary MD for the patient is Default, Provider, MD  LOS - 0   Chief Complaint  Patient presents with  . Flank Pain      Admission history of present illness/brief narrative: Gabriella Martinez is a 34 y.o. female presents with renal stones. Patient states that on Friday morning she was at the [facility] ER with pain in her flank. She states that she was given pain medications for this. She states that she has basically gotten worse and now has had fevers and chills. She has noted some decreased urine output. She states that her urine has not smelled foul. Here she had a CT scan done and this shows presence of hydronephrosis. In addition she has evidence of AKI.   Subjective:   [name] today has, No headache, No chest pain,  - No Nausea, No new weakness tingling or numbness, No Cough - SOB. Still reports dysuria, hematuria, Ruben right flank pain.  Assessment & Plan    Principal Problem:   AKI (acute kidney injury) Active Problems:   Renal stones   Hypotension  Acute renal failure  - Most likely in the setting of obstructive uropathy secondary to renal stones causing hydronephrosis, and volume depletion. - Continues to improve on IV fluids, will increase the rate. - Monitor closely, avoid nephrotoxic medication.  Renal stone/ right renal hydronephrosis - Nephrology consult appreciated - Status post cystoscopy, with JJ stent placement.  UTI - Follow on urine culture, continue with Rocephin.   Code Status: Full  Family Communication: Father at bedside  Disposition Plan: Home when stable   Procedures  Cystoscopy with right ureteral stent placement.   Consults   urology   Medications  Scheduled Meds: .  ceFAZolin (ANCEF) IV  2 g Intravenous 30 min Pre-Op  . cefTRIAXone (ROCEPHIN)  IV  1 g Intravenous Q24H  . feeding supplement (ENSURE ENLIVE)  237 mL Oral BID BM  . folic acid  1 mg Oral Daily  . ketorolac  30 mg Intravenous On Call to OR  . lip balm      . multivitamin with minerals  1 tablet Oral Daily  . Norgestimate-Ethinyl Estradiol Triphasic  1 tablet Oral Daily  . [START ON 03/20/2015] pneumococcal 23 valent vaccine  0.5 mL Intramuscular Tomorrow-1000  . thiamine  100 mg Oral Daily   Continuous Infusions: . sodium chloride 100 mL/hr at 03/19/15 0830   PRN Meds:.acetaminophen **OR** acetaminophen, ondansetron **OR** ondansetron (ZOFRAN) IV, oxyCODONE, oxyCODONE-acetaminophen, phenazopyridine  DVT Prophylaxis   SCDs ,  Will start on [place] heparin  Lab Results  Component Value Date   PLT 134* 03/19/2015    Antibiotics    Anti-infectives    Start     Dose/Rate Route Frequency Ordered Stop   03/19/15 2200  cefTRIAXone (ROCEPHIN) 1 g in dextrose 5 % 50 mL IVPB - Premix     1 g 100 mL/hr over 30 Minutes Intravenous Every 24 hours 03/19/15 1104     03/19/15 0314  ceFAZolin (ANCEF) IVPB 2 g/50 mL premix     2 g 100 mL/hr over 30  Minutes Intravenous 30 min pre-op 03/19/15 0314     03/18/15 2345  cefTRIAXone (ROCEPHIN) 1 g in dextrose 5 % 50 mL IVPB     1 g 100 mL/hr over 30 Minutes Intravenous  Once 03/18/15 2336 03/19/15 0026          Objective:   Filed Vitals:   03/19/15 0330 03/19/15 0510 03/19/15 0757 03/19/15 0759  BP: 90/58  Pulse: 94 83    Temp: 98 F (36.7 C) 98.2 F (36.8 C)    TempSrc: Oral Oral    Resp: 16 16    Height:  (1.6 m)     Weight: 60.056 kg (132 lb 6.4 oz)     SpO2: 95% 96%  98%    Wt Readings from Last 3 Encounters:  03/19/15 60.056 kg (132 lb 6.4 oz)  06/05/12 65.431 kg (144 lb 4 oz)  05/31/12 65.772 kg (145 lb)     Intake/Output Summary (Last 24 hours) at 03/19/15  1107 Last data filed at 03/19/15 0300  Gross per 24 hour  Intake   1550 ml  Output    150 ml  Net   1400 ml     Physical Exam  Awake Alert, Oriented X 3, No new F.N deficits, Normal affect [place].AT,PERRAL Supple Neck,No JVD, No cervical lymphadenopathy appriciated.  Symmetrical Chest wall movement, Good air movement bilaterally, CTAB RRR,No Gallops,Rubs or new Murmurs, No Parasternal Heave +ve B.Sounds, Abd Soft, right CVA tenderness, No organomegaly appriciated, No rebound - guarding or rigidity. No Cyanosis, Clubbing or edema, No new Rash or bruise     Data Review   Micro Results No results found for this or any previous visit (from the past 240 hour(s)).  Radiology Reports Dg Chest 2 View  03/19/2015   CLINICAL DATA:  Flank pain, kidney stone, preop.  EXAM: CHEST  2 VIEW  COMPARISON:  None.  FINDINGS: Lung volumes are low. The cardiomediastinal contours are normal. The lungs are clear. Pulmonary vasculature is normal. No consolidation, pleural effusion, or pneumothorax. No acute osseous abnormalities are seen.  IMPRESSION: Hypoventilatory chest, no acute process.   Electronically Signed   By: Rubye Oaks M.D.   On: 03/19/2015 00:44   US Renal  03/18/2015   CLINICAL DATA:  Right flank pain since 03/14/2015.  EXAM: RENAL/URINARY TRACT ULTRASOUND COMPLETE  COMPARISON:  CT 03/15/2015  FINDINGS: Right Kidney:  Length: 12.8 cm. There is mild right hydronephrosis. Minimal prominence of the right proximal ureter. The 4 mm stone in the mid proximal ureter on prior CT is not seen. Echogenicity within normal limits. No mass visualized.  Left Kidney:  Length: 11.6 cm. Echogenicity within normal limits. No mass or hydronephrosis visualized.  Bladder:  Appears normal for degree of bladder distention. Left ureteral jets are demonstrated. Only intermittent right ureteral jet is seen.  IMPRESSION: Mild right hydronephrosis, similar to prior CT. The 4 mm stone on prior CT in the right ureter is not  seen sonographically.   Electronically Signed   By: Rubye Oaks M.D.   On: 03/18/2015 22:28    CBC  Recent Labs Lab 03/18/15 1725 03/19/15 0522  WBC 8.4 6.9  HGB 11.5* 10.6*  HCT 34.0* 31.5*  PLT 139* 134*  MCV 87.0 87.3  MCH 29.4 29.4  MCHC 33.8 33.7  RDW 15.1 15.3    Chemistries   Recent Labs Lab 03/18/15 1725 03/19/15 0522  NA 137 137  K 3.4* 3.8  CL 107 111  CO2 22  19  GLUCOSE 107* 145*  BUN 47* 39*  CREATININE 2.02* 1.61*  CALCIUM 8.3* 7.7*  AST 32 33  ALT 23 23  ALKPHOS 82 101  BILITOT 0.9 1.4*   ------------------------------------------------------------------------------------------------------------------ estimated creatinine clearance is 41.1 mL/min (by C-G formula based on Cr of 1.61). ------------------------------------------------------------------------------------------------------------------ No results for input(s): HGBA1C in the last 72 hours. ------------------------------------------------------------------------------------------------------------------ No results for input(s): CHOL, HDL, LDLCALC, TRIG, CHOLHDL, LDLDIRECT in the last 72 hours. ------------------------------------------------------------------------------------------------------------------  Recent Labs  03/19/15 0522  TSH 2.221   ------------------------------------------------------------------------------------------------------------------ No results for input(s): VITAMINB12, FOLATE, FERRITIN, TIBC, IRON, RETICCTPCT in the last 72 hours.  Coagulation profile  Recent Labs Lab 03/19/15 0022  INR 1.14    No results for input(s): DDIMER in the last 72 hours.  Cardiac Enzymes No results for input(s): CKMB, TROPONINI, MYOGLOBIN in the last 168 hours.  Invalid input(s): CK ------------------------------------------------------------------------------------------------------------------ Invalid input(s): POCBNP     Time Spent in minutes   30  minutes   ELGERGAWY, DAWOOD M.D on 03/19/2015 at 11:07 AM  Between 7am to 7pm - Pager - [phone]  After 7pm go to [email or web address] - password [email or web address]  And look for the night coverage person covering for me after hours  [facility] Office  [phone]   **Disclaimer: This note may have been dictated with voice recognition software. Similar sounding words can inadvertently be transcribed and this note may contain transcription errors which may not have been corrected upon publication of note.**

## 2015-03-19 NOTE — Progress Notes (Signed)
Day of Surgery Subjective: Patient reports ongoing moderate flank discomfort. She is voiding okay. She has been afebrile.  Objective: Vital signs in last 24 hours: Temp:  [97.4 F (36.3 C)-98.2 F (36.8 C)] 98.2 F (36.8 C) (04/13 0510) Pulse Rate:  [83-104] 83 (04/13 0510) Resp:  [16-18] 16 (04/13 0510) BP: (76-101)/(42-61) 90/58 mmHg (04/13 0759) SpO2:  [95 %-100 %] 98 % (04/13 0759) Weight:  [60.056 kg (132 lb 6.4 oz)] 60.056 kg (132 lb 6.4 oz) (04/13 0330)  Intake/Output from previous day: 04/12 0701 - 04/13 0700 In: 1550 [I.V.:1550] Out: 150 [Urine:150] Intake/Output this shift: Total I/O In: 240 [P.O.:240] Out: -   Physical Exam:  Constitutional: Vital signs reviewed. WD WN in NAD   Eyes: PERRL, No scleral icterus.   Cardiovascular: RRR Pulmonary/Chest: Normal effort Abdominal: Soft. Non-tender, non-distended, bowel sounds are normal, no masses, organomegaly, or guarding present.  Extremities: No cyanosis or edema   Lab Results:  Recent Labs  03/18/15 1725 03/19/15 0522  HGB 11.5* 10.6*  HCT 34.0* 31.5*   BMET  Recent Labs  03/18/15 1725 03/19/15 0522  NA 137 137  K 3.4* 3.8  CL 107 111  CO2 22 19  GLUCOSE 107* 145*  BUN 47* 39*  CREATININE 2.02* 1.61*  CALCIUM 8.3* 7.7*    Recent Labs  03/19/15 0022  INR 1.14   No results for input(s): LABURIN in the last 72 hours. Results for orders placed or performed during the hospital encounter of 06/05/12  Surgical pcr screen     Status: None   Collection Time: 06/05/12  9:33 AM  Result Value Ref Range Status   MRSA, PCR NEGATIVE NEGATIVE Final   Staphylococcus aureus NEGATIVE NEGATIVE Final    Comment:        The Xpert SA Assay (FDA approved for NASAL specimens only), is one component of a comprehensive surveillance program.  It is not intended to diagnose infection nor to guide or monitor treatment.    Studies/Results: Dg Chest 2 View  03/19/2015   CLINICAL DATA:  Flank pain, kidney  stone, preop.  EXAM: CHEST  2 VIEW  COMPARISON:  None.  FINDINGS: Lung volumes are low. The cardiomediastinal contours are normal. The lungs are clear. Pulmonary vasculature is normal. No consolidation, pleural effusion, or pneumothorax. No acute osseous abnormalities are seen.  IMPRESSION: Hypoventilatory chest, no acute process.   Electronically Signed   By: Rubye OaksMelanie  Ehinger M.D.   On: 03/19/2015 00:44   [place] Renal  03/18/2015   CLINICAL DATA:  Right flank pain since 03/14/2015.  EXAM: RENAL/URINARY TRACT ULTRASOUND COMPLETE  COMPARISON:  CT 03/15/2015  FINDINGS: Right Kidney:  Length: 12.8 cm. There is mild right hydronephrosis. Minimal prominence of the right proximal ureter. The 4 mm stone in the mid proximal ureter on prior CT is not seen. Echogenicity within normal limits. No mass visualized.  Left Kidney:  Length: 11.6 cm. Echogenicity within normal limits. No mass or hydronephrosis visualized.  Bladder:  Appears normal for degree of bladder distention. Left ureteral jets are demonstrated. Only intermittent right ureteral jet is seen.  IMPRESSION: Mild right hydronephrosis, similar to prior CT. The 4 mm stone on prior CT in the right ureter is not seen sonographically.   Electronically Signed   By: Rubye OaksMelanie  Ehinger M.D.   On: 03/18/2015 22:28    Assessment/Plan:   Ongoing flank pain status post stent placement. Presumptively Dr. Patsi Searsannenbaum was able to extract a stone but he was unable to do a op report  because Epic apparently was not available at the completion of his surgery. It is not unusual for her to have ongoing flank discomfort status post the procedure with a double-J stent in place. We will keep her today to help with pain management and anticipated discharge tomorrow morning.   LOS: 0 days   Meral Geissinger S 03/19/2015, 1:29 PM

## 2015-03-19 NOTE — H&P (Signed)
[facility] History and Physical  Jaquana Geiger ZOX:[id number] DOB: 1981/02/08 DOA: 03/18/2015  Referring physician: Blake Divine, MD PCP: Default, Provider, MD   Chief Complaint: Renal Stones  HPI: Gabriella Martinez is a 34 y.o. female presents with renal stones. Patient states that on Friday morning she was at the [facility] ER with pain in her flank. She states that she was given pain medications for this. She states that she has basically gotten worse and now has had fevers and chills. She states that her renal function has gotten worse. She has noted some decreased urine output. She states that her urine has not smelled foul. Here she had a CT scan done and this shows presence of hydronephrosis. In addition she has evidence of AKI. We are asked to admit her for urology for further evaluation.   Review of Systems:  12 point ROS performed is unremarkable other than HPI.  Past Medical History  Diagnosis Date  . Kidney stones    Past Surgical History  Procedure Laterality Date  . Lithotripsy    . Cystoscopy w/ ureteral stent placement  05/18/2012    Procedure: CYSTOSCOPY WITH RETROGRADE PYELOGRAM/URETERAL STENT PLACEMENT;  Surgeon: Valetta Fuller, MD;  Location: [facility];  Service: Urology;  Laterality: Left;  . Ureteroscopy  06/05/2012    Procedure: URETEROSCOPY;  Surgeon: Valetta Fuller, MD;  Location: [facility];  Service: Urology;  Laterality: Left;      . Cystoscopy w/ ureteral stent removal  06/05/2012    Procedure: CYSTOSCOPY WITH STENT REMOVAL;  Surgeon: Valetta Fuller, MD;  Location: [facility];  Service: Urology;  Laterality: Left;   Social History:  reports that she has been smoking.  She has never used smokeless tobacco. She reports that she drinks alcohol. She reports that she does not use illicit drugs.  No Known Allergies  History reviewed. No pertinent family history.   Prior to Admission medications   Medication Sig Start Date End Date Taking? Authorizing Provider    ibuprofen (ADVIL,MOTRIN) 200 MG tablet Take 800 mg by mouth every 6 (six) hours as needed. For pain   Yes Historical Provider, MD  ketorolac (TORADOL) 10 MG tablet Take 10 mg by mouth every 6 (six) hours as needed for moderate pain.   Yes Historical Provider, MD  Multiple Vitamin (MULTIVITAMIN WITH MINERALS) TABS tablet Take 1 tablet by mouth daily.   Yes Historical Provider, MD  Norgestim-Eth Charlott Holler Triphasic (ORTHO TRI-CYCLEN LO PO) Take 1 tablet by mouth daily. Take every morning   Yes Historical Provider, MD  oxybutynin (DITROPAN) 5 MG tablet Take 5 mg by mouth every 8 (eight) hours. 05/19/12 05/19/13  Barron Alvine, MD  oxyCODONE (OXY IR/ROXICODONE) 5 MG immediate release tablet Take 5 mg by mouth every 4 (four) hours as needed for moderate pain or severe pain.    Historical Provider, MD  sulfamethoxazole-trimethoprim (SEPTRA DS) 800-160 MG per tablet Take 1 tablet by mouth every 12 (twelve) hours. Patient not taking: Reported on 03/18/2015 10/04/12   Elson Areas, PA-C  traMADol (ULTRAM) 50 MG tablet Take 1 tablet (50 mg total) by mouth every 6 (six) hours as needed for pain. Patient not taking: Reported on 03/18/2015 10/04/12   Elson Areas, PA-C   Physical Exam: Filed Vitals:   03/18/15 2230 03/18/15 2238 03/18/15 2300 03/18/15 2330  BP: 101/61  Pulse: 93 96 94 104  Temp:      TempSrc:      Resp: 16 18  SpO2: 100% 99% 98% 100%    Wt Readings from Last 3 Encounters:  06/05/12 65.431 kg (144 lb 4 oz)  05/31/12 65.772 kg (145 lb)  05/18/12 65.772 kg (145 lb)    General:  Appears calm and comfortable Eyes: PERRL, normal lids, irises & conjunctiva ENT: grossly normal hearing, lips & tongue Neck: no LAD, masses or thyromegaly Cardiovascular: RRR, no m/r/g. No LE edema. Respiratory: CTA bilaterally, no w/r/r. Normal respiratory effort. Abdomen: soft, right flank pain tenderness Skin: no rash or induration seen on limited exam Musculoskeletal: grossly normal  tone BUE/BLE Psychiatric: grossly normal mood and affect, speech fluent and appropriate Neurologic: grossly non-focal.          Labs on Admission:  Basic Metabolic Panel:  Recent Labs Lab 03/18/15 1725  NA 137  K 3.4*  CL 107  CO2 22  GLUCOSE 107*  BUN 47*  CREATININE 2.02*  CALCIUM 8.3*   Liver Function Tests:  Recent Labs Lab 03/18/15 1725  AST 32  ALT 23  ALKPHOS 82  BILITOT 0.9  PROT 6.6  ALBUMIN 3.2*   No results for input(s): LIPASE, AMYLASE in the last 168 hours. No results for input(s): AMMONIA in the last 168 hours. CBC:  Recent Labs Lab 03/18/15 1725  WBC 8.4  HGB 11.5*  HCT 34.0*  MCV 87.0  PLT 139*   Cardiac Enzymes: No results for input(s): CKTOTAL, CKMB, CKMBINDEX, TROPONINI in the last 168 hours.  BNP (last 3 results) No results for input(s): BNP in the last 8760 hours.  ProBNP (last 3 results) No results for input(s): PROBNP in the last 8760 hours.  CBG: No results for input(s): GLUCAP in the last 168 hours.  Radiological Exams on Admission: [place] Renal  03/18/2015   CLINICAL DATA:  Right flank pain since 03/14/2015.  EXAM: RENAL/URINARY TRACT ULTRASOUND COMPLETE  COMPARISON:  CT 03/15/2015  FINDINGS: Right Kidney:  Length: 12.8 cm. There is mild right hydronephrosis. Minimal prominence of the right proximal ureter. The 4 mm stone in the mid proximal ureter on prior CT is not seen. Echogenicity within normal limits. No mass visualized.  Left Kidney:  Length: 11.6 cm. Echogenicity within normal limits. No mass or hydronephrosis visualized.  Bladder:  Appears normal for degree of bladder distention. Left ureteral jets are demonstrated. Only intermittent right ureteral jet is seen.  IMPRESSION: Mild right hydronephrosis, similar to prior CT. The 4 mm stone on prior CT in the right ureter is not seen sonographically.   Electronically Signed   By: Rubye OaksMelanie  Ehinger M.D.   On: 03/18/2015 22:28      Assessment/Plan Principal Problem:   AKI  (acute kidney injury) Active Problems:   Renal stones   Hypotension   1. AKI -likely due to renal obstruction -will monitor labs -will also start on IVF now -possiblu underlying UTI will start on Rocephin  2. Renal Stones -urology consult -will be scheduled for procedure tonight  3. Hypotension -responding to fluids -will continue with IVF now  -monitor pressures  4. Anemia -she is not aware of anemia -will get iron studies -monitor labs   Code Status: Full Code (must indicate code status--if unknown or must be presumed, indicate so) DVT Prophylaxis:TEDS Family Communication: none (indicate person spoken with, if applicable, with phone number if by telephone) Disposition Plan: Home (indicate anticipated LOS)  Time spent: 70min  [facility]KHAN,Tomeca Helm A [facility] Pager [phone]

## 2015-03-19 NOTE — Progress Notes (Signed)
INITIAL NUTRITION ASSESSMENT  DOCUMENTATION CODES Per approved criteria  -Not Applicable   INTERVENTION: Continue to encourage PO intake  NUTRITION DIAGNOSIS: Inadequate protein-energy intake related to decreased appetite with pain as evidenced by decreased intakes for 4 days.   Goal: Pt to meet >/=90% estimated needs  Monitor:  Meal intakes, weight trends, labs  Reason for Assessment: Malnutrition Screening Tool assessment  35 y.o. female  Admitting Dx: AKI (acute kidney injury)  ASSESSMENT: This is a 34 year old female who was admitted for renal stones requiring surgery.  Pt reports decreased intakes x4 days related to severe abdominal pain. She states that eating does not make pain worse. She reports prior to this episode she had a great appetite and her weight has been stable.   She states she ate some mashed potatoes and chicken salad for breakfast and had plans to order something for lunch around 1 PM today.   Pt likely not fully meeting needs; will monitor for need to add a supplement. Physical assessment did not show signs of wasting or edema.  Labs reviewed with impaired renal function.  Height: Ht Readings from Last 1 Encounters:  03/19/15  (1.6 m)    Weight: Wt Readings from Last 1 Encounters:  03/19/15 132 lb 6.4 oz (60.056 kg)    Ideal Body Weight: 115 lbs (52.27 kg)  % Ideal Body Weight: 115%  Wt Readings from Last 10 Encounters:  03/19/15 132 lb 6.4 oz (60.056 kg)  06/05/12 144 lb 4 oz (65.431 kg)  05/31/12 145 lb (65.772 kg)  05/18/12 145 lb (65.772 kg)    Usual Body Weight: 130 lbs (59.1 kg)  % Usual Body Weight: 101%  BMI:  Body mass index is 23.46 kg/(m^2).  Estimated Nutritional Needs: Kcal: 1500-1800 Protein: 60-80 grams Fluid: >2L/day for pt noted be dehydrated on admission  Skin: WDL  Diet Order: Diet regular Room service appropriate?: Yes; Fluid consistency:: Thin  EDUCATION NEEDS: -No education needs identified at  this time   Intake/Output Summary (Last 24 hours) at 03/19/15 1303 Last data filed at 03/19/15 1030  Gross per 24 hour  Intake   1790 ml  Output    150 ml  Net   1640 ml    Last BM: PTA   Labs:   Recent Labs Lab 03/18/15 1725 03/19/15 0522  NA 137 137  K 3.4* 3.8  CL 107 111  CO2 22 19  BUN 47* 39*  CREATININE 2.02* 1.61*  CALCIUM 8.3* 7.7*  GLUCOSE 107* 145*    CBG (last 3)   Recent Labs  03/19/15 0738  GLUCAP 205*    Scheduled Meds: .  ceFAZolin (ANCEF) IV  2 g Intravenous 30 min Pre-Op  . cefTRIAXone (ROCEPHIN)  IV  1 g Intravenous Q24H  . feeding supplement (ENSURE ENLIVE)  237 mL Oral BID BM  . folic acid  1 mg Oral Daily  . heparin subcutaneous  5,000 Units Subcutaneous 3 times per day  . ketorolac  30 mg Intravenous On Call to OR  . lip balm      . multivitamin with minerals  1 tablet Oral Daily  . [START ON 03/20/2015] pneumococcal 23 valent vaccine  0.5 mL Intramuscular Tomorrow-1000  . thiamine  100 mg Oral Daily    Continuous Infusions: . sodium chloride 125 mL/hr at 03/19/15 1103    Past Medical History  Diagnosis Date  . Kidney stones     Past Surgical History  Procedure Laterality Date  . Lithotripsy    .  Cystoscopy w/ ureteral stent placement  05/18/2012    Procedure: CYSTOSCOPY WITH RETROGRADE PYELOGRAM/URETERAL STENT PLACEMENT;  Surgeon: Valetta Fulleravid S Grapey, MD;  Location: [facility];  Service: Urology;  Laterality: Left;  . Ureteroscopy  06/05/2012    Procedure: URETEROSCOPY;  Surgeon: Valetta Fulleravid S Grapey, MD;  Location: [facility];  Service: Urology;  Laterality: Left;      . Cystoscopy w/ ureteral stent removal  06/05/2012    Procedure: CYSTOSCOPY WITH STENT REMOVAL;  Surgeon: Valetta Fulleravid S Grapey, MD;  Location: [facility];  Service: Urology;  Laterality: Left;    Trenton GammonJessica Ayleen Mckinstry, RD, LDN Inpatient Clinical Dietitian Pager # [phone] After hours/weekend pager # [phone]

## 2015-03-19 NOTE — ED Notes (Signed)
MD at bedside. Hospitalist

## 2015-03-19 NOTE — ED Notes (Signed)
Patient transported to X-ray 

## 2015-03-19 NOTE — H&P (Signed)
Urology Admission H&P Cc: Dr. Isabel CapriceGrapey Chief Complaint:  Right flank painwith known 4mm Right ureteral stone ( [place]).+  History of Present Illness:   Gabriella Martinez is a 34 yo female seen in [facility] with  A 4mm Right lower ureteral stone, diagnosed [facility] [place] on Saturday. She has had Right flank pain and RLQ pain since Friday. 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 No gross hematuria. + nausea and vomiting. [facility] prior to Urology consultation. Her pain has not improved in that time. She will need cystoscopy, and Right retrograde pyelogtram, and JJ stent,and f/u with Dr. Isabel CapriceGrapey.   Past Medical History  Diagnosis Date  . Kidney stones    Past Surgical History  Procedure Laterality Date  . Lithotripsy    . Cystoscopy w/ ureteral stent placement  05/18/2012    Procedure: CYSTOSCOPY WITH RETROGRADE PYELOGRAM/URETERAL STENT PLACEMENT;  Surgeon: Valetta Fulleravid S Grapey, MD;  Location: [facility];  Service: Urology;  Laterality: Left;  . Ureteroscopy  06/05/2012    Procedure: URETEROSCOPY;  Surgeon: Valetta Fulleravid S Grapey, MD;  Location: [facility];  Service: Urology;  Laterality: Left;      . Cystoscopy w/ ureteral stent removal  06/05/2012    Procedure: CYSTOSCOPY WITH STENT REMOVAL;  Surgeon: Valetta Fulleravid S Grapey, MD;  Location: [facility];  Service: Urology;  Laterality: Left;    Home Medications:   (Not in a hospital admission) Allergies: No Known Allergies  History reviewed. No pertinent family history. Social History:  reports that she has been smoking.  She has never used smokeless tobacco. She reports that she drinks alcohol. She reports that she does not use illicit drugs.  Review of Systems  Constitutional: Positive for fever, chills, malaise/fatigue and diaphoresis. Negative for weight loss.  HENT: Negative.   Eyes: Negative.   Respiratory: Negative.   Cardiovascular:  Negative.   Gastrointestinal: Positive for nausea, vomiting and abdominal pain. Negative for heartburn, diarrhea, constipation and blood in stool.  Genitourinary: Positive for urgency and flank pain. Negative for dysuria, frequency and hematuria.  Musculoskeletal: Negative.   Skin: Negative for itching and rash.  Neurological: Positive for weakness.  Endo/Heme/Allergies: Negative.   Psychiatric/Behavioral: Negative.     Physical Exam:  Vital signs in last 24 hours: Temp:  [97.4 F (36.3 C)] 97.4 F (36.3 C) (04/12 1703) Pulse Rate:  [91-104] 104 (04/12 2330) Resp:  [16-18] 18 (04/12 2238) BP: (76-101)/(42-61) 101/61 mmHg (04/12 2330) SpO2:  [98 %-100 %] 100 % (04/12 2330) Physical Exam  Constitutional: She appears well-developed and well-nourished.  HENT:  Head: Normocephalic.  Eyes: EOM are normal. Pupils are equal, round, and reactive to light.  Neck: Normal range of motion.  Cardiovascular: Regular rhythm.   Respiratory: No respiratory distress. She has no wheezes.  GI: Soft. She exhibits no distension and no mass. There is no tenderness. There is no rebound and no guarding.  Right flank pain to deep palpation, and Right ower quadrant pain to deep palpation.   Musculoskeletal: Normal range of motion.  Neurological: She is alert.  Skin: Skin is warm. She is diaphoretic.  Psychiatric: She has a normal mood and affect. Her behavior is normal. Judgment and thought content normal.    Laboratory Data:  Results for orders placed or performed during the hospital encounter of 03/18/15 (from the past 24 hour(s))  U/A (may I&O cath if menses)     Status: Abnormal   Collection Time: 03/18/15  4:54 PM  Result Value Ref Range   Color, Urine AMBER (A) YELLOW   APPearance TURBID (A) CLEAR   Specific Gravity, Urine 1.023 1.005 - 1.030   pH 5.5 5.0 - 8.0   Glucose, UA NEGATIVE NEGATIVE mg/dL   Hgb urine dipstick LARGE (A) NEGATIVE   Bilirubin Urine MODERATE (A) NEGATIVE   Ketones, ur  NEGATIVE NEGATIVE mg/dL   Protein, ur 960 (A) NEGATIVE mg/dL   Urobilinogen, UA 1.0 0.0 - 1.0 mg/dL   Nitrite NEGATIVE NEGATIVE   Leukocytes, UA LARGE (A) NEGATIVE  Urine microscopic-add on     Status: Abnormal   Collection Time: 03/18/15  4:54 PM  Result Value Ref Range   Squamous Epithelial / LPF FEW (A) RARE   WBC, UA TOO NUMEROUS TO COUNT <3 WBC/hpf   RBC / HPF TOO NUMEROUS TO COUNT <3 RBC/hpf   Casts GRANULAR CAST (A) NEGATIVE   Urine-Other MUCOUS PRESENT   Pregnancy, urine     Status: None   Collection Time: 03/18/15  4:54 PM  Result Value Ref Range   Preg Test, Ur NEGATIVE NEGATIVE  CBC (if pt has a temp above 100.46F)     Status: Abnormal   Collection Time: 03/18/15  5:25 PM  Result Value Ref Range   WBC 8.4 4.0 - 10.5 K/uL   RBC 3.91 3.87 - 5.11 MIL/uL   Hemoglobin 11.5 (L) 12.0 - 15.0 g/dL   HCT 45.4 (L) 09.8 - 11.9 %   MCV 87.0 78.0 - 100.0 fL   MCH 29.4 26.0 - 34.0 pg   MCHC 33.8 30.0 - 36.0 g/dL   RDW 14.7 82.9 - 56.2 %   Platelets 139 (L) 150 - 400 K/uL  Comprehensive metabolic panel (if pt has a temp above 100.46F)     Status: Abnormal   Collection Time: 03/18/15  5:25 PM  Result Value Ref Range   Sodium 137 135 - 145 mmol/L   Potassium 3.4 (L) 3.5 - 5.1 mmol/L   Chloride 107 96 - 112 mmol/L   CO2 22 19 - 32 mmol/L   Glucose, Bld 107 (H) 70 - 99 mg/dL   BUN 47 (H) 6 - 23 mg/dL   Creatinine, Ser 1.30 (H) 0.50 - 1.10 mg/dL   Calcium 8.3 (L) 8.4 - 10.5 mg/dL   Total Protein 6.6 6.0 - 8.3 g/dL   Albumin 3.2 (L) 3.5 - 5.2 g/dL   AST 32 0 - 37 U/L   ALT 23 0 - 35 U/L   Alkaline Phosphatase 82 39 - 117 U/L   Total Bilirubin 0.9 0.3 - 1.2 mg/dL   GFR calc non Af Amer 31 (L) >90 mL/min   GFR calc Af Amer 36 (L) >90 mL/min   Anion gap 8 5 - 15   No results found for this or any previous visit (from the past 240 hour(s)). Creatinine:  Recent Labs  03/18/15 1725  CREATININE 2.02*   Baseline Creatinine:   Impression/Assessment:    Right flank pain and RLQ  pain 47m=ndary to stone 2. dehrdration and AKD with elevated Creatinine.  3. Will need surgery and overnight admission.   Plan:  Surgery and overnight admission  Isam Unrein I Gabryelle Whitmoyer 03/19/2015, 12:02 AM

## 2015-03-20 LAB — CBC
HEMATOCRIT: 27.8 % — AB (ref 36.0–46.0)
Hemoglobin: 9.2 g/dL — ABNORMAL LOW (ref 12.0–15.0)
MCH: 28.9 pg (ref 26.0–34.0)
MCHC: 33.1 g/dL (ref 30.0–36.0)
MCV: 87.4 fL (ref 78.0–100.0)
Platelets: 170 10*3/uL (ref 150–400)
RBC: 3.18 MIL/uL — ABNORMAL LOW (ref 3.87–5.11)
RDW: 15.7 % — AB (ref 11.5–15.5)
WBC: 10 10*3/uL (ref 4.0–10.5)

## 2015-03-20 LAB — BASIC METABOLIC PANEL
Anion gap: 6 (ref 5–15)
BUN: 35 mg/dL — AB (ref 6–23)
CO2: 18 mmol/L — ABNORMAL LOW (ref 19–32)
Calcium: 8.3 mg/dL — ABNORMAL LOW (ref 8.4–10.5)
Chloride: 117 mmol/L — ABNORMAL HIGH (ref 96–112)
Creatinine, Ser: 1.35 mg/dL — ABNORMAL HIGH (ref 0.50–1.10)
GFR calc Af Amer: 59 mL/min — ABNORMAL LOW (ref >90)
GFR, EST NON AFRICAN AMERICAN: 51 mL/min — AB (ref >90)
Glucose, Bld: 165 mg/dL — ABNORMAL HIGH (ref 70–99)
POTASSIUM: 3.9 mmol/L (ref 3.5–5.1)
SODIUM: 141 mmol/L (ref 135–145)

## 2015-03-20 LAB — GLUCOSE, CAPILLARY: GLUCOSE-CAPILLARY: 125 mg/dL — AB (ref 70–99)

## 2015-03-20 LAB — HEMOGLOBIN A1C
HEMOGLOBIN A1C: 5.6 % (ref 4.8–5.6)
MEAN PLASMA GLUCOSE: 114 mg/dL

## 2015-03-20 MED ORDER — AMOXICILLIN 500 MG PO CAPS
500.0000 mg | ORAL_CAPSULE | Freq: Three times a day (TID) | ORAL | Status: DC
  Filled 2015-03-20: qty 1

## 2015-03-20 MED ORDER — FOSFOMYCIN TROMETHAMINE 3 G PO PACK
3.0000 g | PACK | Freq: Once | ORAL | Status: AC
  Administered 2015-03-20: 3 g via ORAL
  Filled 2015-03-20: qty 3

## 2015-03-20 MED ORDER — AMOXICILLIN 500 MG PO CAPS: 500.0000 mg | ORAL_CAPSULE | Freq: Three times a day (TID) | ORAL | Status: AC

## 2015-03-20 MED ORDER — OXYBUTYNIN CHLORIDE 5 MG PO TABS: 5.0000 mg | ORAL_TABLET | Freq: Three times a day (TID) | ORAL | Status: AC

## 2015-03-20 MED ORDER — OXYCODONE-ACETAMINOPHEN 5-325 MG PO TABS: 1.0000 | ORAL_TABLET | ORAL | Status: AC | PRN

## 2015-03-20 MED ORDER — SODIUM CHLORIDE 0.9 % IV BOLUS (SEPSIS): 1000.0000 mL | Freq: Once | INTRAVENOUS | Status: DC

## 2015-03-20 MED ORDER — PHENAZOPYRIDINE HCL 200 MG PO TABS: 200.0000 mg | ORAL_TABLET | Freq: Three times a day (TID) | ORAL | Status: AC | PRN

## 2015-03-20 NOTE — Discharge Instructions (Signed)
[facility] [phone] Post Ureteroscopy With or Without Stent Instructions  Definitions:  Ureter: The duct that transports urine from the kidney to the bladder. Stent:   A plastic hollow tube that is placed into the ureter, from the kidney to the                 bladder to prevent the ureter from swelling shut.  GENERAL INSTRUCTIONS:  Despite the fact that no skin incisions were used, the area around the ureter and bladder is raw and irritated. The stent is a foreign body which will further irritate the bladder wall. This irritation is manifested by increased frequency of urination, both day and night, and by an increase in the urge to urinate. In some, the urge to urinate is present almost always. Sometimes the urge is strong enough that you may not be able to stop yourself from urinating. The only real cure is to remove the stent and then give time for the bladder wall to heal which can't be done until the danger of the ureter swelling shut has passed, which varies.  You may see some blood in your urine while the stent is in place and a few days afterwards. Do not be alarmed, even if the urine was clear for a while. Get off your feet and drink lots of fluids until clearing occurs. If you start to pass clots or don't improve, call us.  DIET: You may return to your normal diet immediately. Because of the raw surface of your bladder, alcohol, spicy foods, acid type foods and drinks with caffeine may cause irritation or frequency and should be used in moderation. To keep your urine flowing freely and to avoid constipation, drink plenty of fluids during the day ( 8-10 glasses ). Tip: Avoid cranberry juice because it is very acidic.  ACTIVITY: Your physical activity doesn't need to be restricted. However, if you are very active, you may see some blood in your urine. We suggest that you reduce your activity under these circumstances until the bleeding has stopped.  BOWELS: It is  important to keep your bowels regular during the postoperative period. Straining with bowel movements can cause bleeding. A bowel movement every other day is reasonable. Use a mild laxative if needed, such as Milk of Magnesia 2-3 tablespoons, or 2 Dulcolax tablets. Call if you continue to have problems. If you have been taking narcotics for pain, before, during or after your surgery, you may be constipated. Take a laxative if necessary.   MEDICATION: You should resume your pre-surgery medications unless told not to. In addition you will often be given an antibiotic to prevent infection. These should be taken as prescribed until the bottles are finished unless you are having an unusual reaction to one of the drugs.  PROBLEMS YOU SHOULD REPORT TO US:  Fevers over 100.5 Fahrenheit.  Heavy bleeding, or clots ( See above notes about blood in urine ).  Inability to urinate.  Drug reactions ( hives, rash, nausea, vomiting, diarrhea ).  Severe burning or pain with urination that is not improving.  FOLLOW-UP: You will need a follow-up appointment to monitor your progress. Call for this appointment at the number listed above. Usually the first appointment will be about three to fourteen days after your surgery.   You may remove stent by pulling on the string Sunday this week- If you are uncorfortable doing this call our office Monday am for a nurse visit appointment to remove the stent

## 2015-03-20 NOTE — Progress Notes (Signed)
Work note written that pt can resume work 4/15 if able to per Dr. Isabel CapriceGrapey.

## 2015-03-20 NOTE — Discharge Summary (Signed)
Gabriella Martinez, 34 y.o., DOB 1981-10-18, MRN [id number]. Admission date: 03/18/2015 Discharge Date 03/20/2015 Primary MD Default, Provider, MD Admitting Physician Jethro Bolus, MD   PCP/urology please follow on: - Follow on the sensitivity of urine culture is growing enterococcus. - RepeaAdmission Diagnosis  Right ureteral stone [N20.1] Urinary tract infection with hematuria, site unspecified [N39.0, R31.9]  Discharge Diagnosis   Principal Problem:   AKI (acute kidney injury) Active Problems:   Renal stones   Hypotension   Past Medical History  Diagnosis Date  . Kidney stones     Past Surgical History  Procedure Laterality Date  . Lithotripsy    . Cystoscopy w/ ureteral stent placement  05/18/2012    Procedure: CYSTOSCOPY WITH RETROGRADE PYELOGRAM/URETERAL STENT PLACEMENT;  Surgeon: Valetta Fuller, MD;  Location: [facility];  Service: Urology;  Laterality: Left;  . Ureteroscopy  06/05/2012    Procedure: URETEROSCOPY;  Surgeon: Valetta Fuller, MD;  Location: [facility];  Service: Urology;  Laterality: Left;      . Cystoscopy w/ ureteral stent removal  06/05/2012    Procedure: CYSTOSCOPY WITH STENT REMOVAL;  Surgeon: Valetta Fuller, MD;  Location: [facility];  Service: Urology;  Laterality: Left;     Hospital Course See H&P, Labs, Consult and Test reports for all details in brief, patient was admitted for **  Principal Problem:   AKI (acute kidney injury) Active Problems:   Renal stones   Hypotension  Admission history of present illness/brief narrative: Gabriella Martinez is a 34 y.o. female presents with renal stones. Patient states that on Friday morning she was at the [facility] ER with pain in her flank. She states that she was given pain medications for this. She states that she has basically gotten worse and now has had fevers and chills. She has noted some decreased urine output. She states that her urine has not smelled foul. Here she had a CT scan done and this shows presence of  hydronephrosis. In addition she has evidence of AKI.  Acute renal failure  - Most likely in the setting of obstructive uropathy secondary to renal stones causing hydronephrosis, and volume depletion. - Continue proton IV fluids, creatinine is 1.3 at day of discharge, she was instructed to drink plenty of fluids, avoid NSAIDs.  Renal stone/ right renal hydronephrosis - Status post cystoscopy, with JJ stent placement. - Follow with urology as an outpatient.  UTI - Treated with Rocephin 2 days , urine culture growing enterococcus at day of discharge , no sensitivity yet , will give one dose of fosfomycin. Discharge , and was discharged on total of 10 days of oral amoxicillin .   Consults   Urology   Significant Tests:  See full reports for all details    Dg Chest 2 View  03/19/2015   CLINICAL DATA:  Flank pain, kidney stone, preop.  EXAM: CHEST  2 VIEW  COMPARISON:  None.  FINDINGS: Lung volumes are low. The cardiomediastinal contours are normal. The lungs are clear. Pulmonary vasculature is normal. No consolidation, pleural effusion, or pneumothorax. No acute osseous abnormalities are seen.  IMPRESSION: Hypoventilatory chest, no acute process.   Electronically Signed   By: Rubye Oaks M.D.   On: 03/19/2015 00:44   US Renal  03/18/2015   CLINICAL DATA:  Right flank pain since 03/14/2015.  EXAM: RENAL/URINARY TRACT ULTRASOUND COMPLETE  COMPARISON:  CT 03/15/2015  FINDINGS: Right Kidney:  Length: 12.8 cm. There is mild right hydronephrosis. Minimal prominence of the right proximal ureter. The  4 mm stone in the mid proximal ureter on prior CT is not seen. Echogenicity within normal limits. No mass visualized.  Left Kidney:  Length: 11.6 cm. Echogenicity within normal limits. No mass or hydronephrosis visualized.  Bladder:  Appears normal for degree of bladder distention. Left ureteral jets are demonstrated. Only intermittent right ureteral jet is seen.  IMPRESSION: Mild right hydronephrosis,  similar to prior CT. The 4 mm stone on prior CT in the right ureter is not seen sonographically.   Electronically Signed   By: Rubye Oaks M.D.   On: 03/18/2015 22:28     Today   Subjective:   Gabriella Martinez today has no headache,no chest abdominal pain,no new weakness tingling or numbness, feels much better wants to go home today.   Objective:   Blood pressure 122/92, pulse 82, temperature 97.6 F (36.4 C), temperature source Oral, resp. rate 16, height  (1.6 m), weight 60.056 kg (132 lb 6.4 oz), last menstrual period 03/16/2015, SpO2 98 %.  Intake/Output Summary (Last 24 hours) at 03/20/15 1010 Last data filed at 03/19/15 2300  Gross per 24 hour  Intake 2518.75 ml  Output    600 ml  Net 1918.75 ml    Exam Awake Alert, Oriented *3, No new F.N deficits, Normal affect [place].AT,PERRAL Supple Neck,No JVD, No cervical lymphadenopathy appriciated.  Symmetrical Chest wall movement, Good air movement bilaterally, CTAB RRR,No Gallops,Rubs or new Murmurs, No Parasternal Heave +ve B.Sounds, Abd Soft, Non tender, No organomegaly appriciated, No rebound -guarding or rigidity. No Cyanosis, Clubbing or edema, No new Rash or bruise  Data Review   Cultures -  Results for orders placed or performed during the hospital encounter of 03/18/15  Urine culture     Status: None (Preliminary result)   Collection Time: 03/18/15 11:39 PM  Result Value Ref Range Status   Specimen Description URINE, CLEAN CATCH  Final   Special Requests NONE  Final   Colony Count   Final    >=100,000 COLONIES/ML Performed at [facility]    Culture   Final    ENTEROCOCCUS SPECIES Performed at [facility]    Report Status PENDING  Incomplete     CBC w Diff: Lab Results  Component Value Date   WBC 10.0 03/20/2015   HGB 9.2* 03/20/2015   HCT 27.8* 03/20/2015   PLT 170 03/20/2015   LYMPHOPCT 25 05/31/2012   MONOPCT 6 05/31/2012   EOSPCT 5 05/31/2012   BASOPCT 1 05/31/2012   CMP:  Lab Results  Component Value Date   NA 141 03/20/2015   K 3.9 03/20/2015   CL 117* 03/20/2015   CO2 18* 03/20/2015   BUN 35* 03/20/2015   CREATININE 1.35* 03/20/2015   PROT 5.2* 03/19/2015   ALBUMIN 2.4* 03/19/2015   BILITOT 1.4* 03/19/2015   ALKPHOS 101 03/19/2015   AST 33 03/19/2015   ALT 23 03/19/2015  .  Micro Results Recent Results (from the past 240 hour(s))  Urine culture     Status: None (Preliminary result)   Collection Time: 03/18/15 11:39 PM  Result Value Ref Range Status   Specimen Description URINE, CLEAN CATCH  Final   Special Requests NONE  Final   Colony Count   Final    >=100,000 COLONIES/ML Performed at [facility]    Culture   Final    ENTEROCOCCUS SPECIES Performed at [facility]    Report Status PENDING  Incomplete     Discharge Instructions  Encouraged to drink plenty of fluids. Follow-up Information    [facility]   Specialty:  Urology   Why:  to follow on urine culture results   Contact information:   [address] [place] [address] [phone]       Discharge Medications     Medication List    STOP taking these medications        ibuprofen 200 MG tablet  Commonly known as:  ADVIL,MOTRIN     ketorolac 10 MG tablet  Commonly known as:  TORADOL     oxyCODONE 5 MG immediate release tablet  Commonly known as:  Oxy IR/ROXICODONE     sulfamethoxazole-trimethoprim 800-160 MG per tablet  Commonly known as:  SEPTRA DS     traMADol 50 MG tablet  Commonly known as:  ULTRAM      TAKE these medications        amoxicillin 500 MG capsule  Commonly known as:  AMOXIL  Take 1 capsule (500 mg total) by mouth 3 (three) times daily.     multivitamin with minerals Tabs tablet  Take 1 tablet by mouth daily.     ORTHO TRI-CYCLEN LO PO  Take 1 tablet by mouth daily. Take every morning     oxybutynin 5 MG tablet  Commonly known as:  DITROPAN  Take 1 tablet (5 mg total) by mouth every 8  (eight) hours.     oxyCODONE-acetaminophen 5-325 MG per tablet  Commonly known as:  PERCOCET/ROXICET  Take 1 tablet by mouth every 4 (four) hours as needed for moderate pain.     phenazopyridine 200 MG tablet  Commonly known as:  PYRIDIUM  Take 1 tablet (200 mg total) by mouth 3 (three) times daily as needed for pain.         Total Time in preparing paper work, data evaluation and todays exam - 35 minutes  Dariya Gainer M.D on 03/20/2015 at 10:10 AM  [facility] Office  [phone]

## 2015-03-20 NOTE — Progress Notes (Addendum)
Pt very agitated and upset this morning during assessment and throughout day.  Pt stated that nurses did not know what they are doing and don't believe she is in pain.  Pt refusing fluid bolus and to take the first dose of antibiotic before leaving.  Explained and educated on importance of bolus and medication.  Gave prescriptions and discharge paperwork.  Explained importance of taking medications as prescribed and answered all questions.  Pt still very upset and stated that she would write negative reviews about hospital.  Will wheel pt out when ride is here.

## 2015-03-21 LAB — URINE CULTURE: Colony Count: >=100000

## 2015-03-21 MED ORDER — IOHEXOL 300 MG/ML  SOLN
INTRAMUSCULAR | Status: DC | PRN
  Administered 2015-03-21: 50 mL

## 2015-03-21 MED ORDER — SODIUM CHLORIDE 0.9 % IR SOLN
Status: DC | PRN
  Administered 2015-03-21: 1000 mL

## 2015-03-21 MED ORDER — 0.9 % SODIUM CHLORIDE (POUR BTL) OPTIME
TOPICAL | Status: DC | PRN
  Administered 2015-03-21: 1000 mL

## 2015-03-24 ENCOUNTER — Encounter (HOSPITAL_COMMUNITY): Payer: Self-pay | Admitting: Urology

## 2015-03-24 NOTE — Op Note (Addendum)
Pre-operative diagnosis :  Right distal ureteral stone  Postoperative diagnosis:  Same  Operation:  Cystourethroscopy, right record pyelogram interpretation, pass extraction right distal ureteral calculus, right double-J stent, 6 French by 24 cm, with suture left intact  Surgeon:  S. Patsi Searsannenbaum, MD  First assistant:  None  Anesthesia:  Gen. LMA  Preparation:  After appropriate preanesthesia, the patient was brought the operating room, placed on the operating table in the dorsal supine position where general LMA anesthesia was introduced. She was replaced in the dorsal lithotomy position with pubis was prepped with Betadine solution and draped in the usual fashion. The history was reviewed. The armband was double checked.  Review history:  Cc: Gabriella Martinez Chief Complaint: Right flank pain with known 4mm Right ureteral stone ( [place]).  History of Present Illness: Gabriella Martinez is a 34 yo female seen in [facility] with A 4mm Right lower ureteral stone, [facility] [place] on Saturday. She has had Right flank pain and RLQ pain since Friday. 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 No gross hematuria. + nausea and vomiting. [facility] Urology consultation. Her pain has not improved in that time. She will need cystoscopy, and Right retrograde pyelogtram, and JJ stent,and f/u with Gabriella Martinez.    Statement of  Likelihood of Success: Excellent. TIME-OUT observed.:  Procedure:  Cystourethroscopy was accomplished, and the urethra was noted to be within normal limits. The bladder base was normal with normal trigone. Ureteral orifices were normal. The right ureteral orifice was cannulated, Retrograde pyelogram showed stone in the distal ureter, at the level of the intramural ureter. A 0.038 guidewire was then passed into and coiled in the renal pelvis. The  short ureteroscope was passed in the ureter, and the stone identified and basket extracted. Following this, a 6 [place] by 24 7 m double-J stent was passed over the guidewire into the renal pelvis and coiled. This was also coiled in the bladder, with the suture left on the stent. This was taped to the patient's pubis. The patient received IV Tylenol and IV Toradol. She was awakened and taken to recovery room in good condition.   This operation took place on 03/19/15. EPIC system was down, and handwritten note done until dictated note done. ST

## 2015-03-25 LAB — CULTURE, BLOOD (ROUTINE X 2)
Culture: NO GROWTH
Culture: NO GROWTH

## 2015-07-05 ENCOUNTER — Encounter (HOSPITAL_COMMUNITY): Payer: Self-pay | Admitting: Emergency Medicine

## 2015-07-05 ENCOUNTER — Emergency Department (HOSPITAL_COMMUNITY)
Admission: EM | Admit: 2015-07-05 | Discharge: 2015-07-05 | Discharge status: Against Medical Advice | Payer: Self-pay | Attending: Emergency Medicine | Admitting: Emergency Medicine

## 2015-07-05 ENCOUNTER — Emergency Department (HOSPITAL_COMMUNITY): Payer: Self-pay

## 2015-07-05 DIAGNOSIS — Z79899 Other long term (current) drug therapy: Secondary | ICD-10-CM | POA: Insufficient documentation

## 2015-07-05 DIAGNOSIS — Z87442 Personal history of urinary calculi: Secondary | ICD-10-CM | POA: Insufficient documentation

## 2015-07-05 DIAGNOSIS — R109 Unspecified abdominal pain: Secondary | ICD-10-CM | POA: Insufficient documentation

## 2015-07-05 DIAGNOSIS — Z3202 Encounter for pregnancy test, result negative: Secondary | ICD-10-CM | POA: Insufficient documentation

## 2015-07-05 DIAGNOSIS — Z720 Tobacco use: Secondary | ICD-10-CM | POA: Insufficient documentation

## 2015-07-05 LAB — URINALYSIS, ROUTINE W REFLEX MICROSCOPIC
Bilirubin Urine: NEGATIVE
Glucose, UA: NEGATIVE mg/dL
HGB URINE DIPSTICK: NEGATIVE
KETONES UR: 15 mg/dL — AB
NITRITE: NEGATIVE
PH: 6.5 (ref 5.0–8.0)
PROTEIN: NEGATIVE mg/dL
Specific Gravity, Urine: 1.025 (ref 1.005–1.030)
Urobilinogen, UA: 0.2 mg/dL (ref 0.0–1.0)

## 2015-07-05 LAB — COMPREHENSIVE METABOLIC PANEL
ALBUMIN: 4.5 g/dL (ref 3.5–5.0)
ALT: 15 U/L (ref 14–54)
ANION GAP: 7 (ref 5–15)
AST: 21 U/L (ref 15–41)
Alkaline Phosphatase: 57 U/L (ref 38–126)
BILIRUBIN TOTAL: 0.8 mg/dL (ref 0.3–1.2)
BUN: 17 mg/dL (ref 6–20)
CHLORIDE: 110 mmol/L (ref 101–111)
CO2: 24 mmol/L (ref 22–32)
Calcium: 9.2 mg/dL (ref 8.9–10.3)
Creatinine, Ser: 0.96 mg/dL (ref 0.44–1.00)
GFR calc Af Amer: >60 mL/min (ref >60)
GFR calc non Af Amer: >60 mL/min (ref >60)
Glucose, Bld: 105 mg/dL — ABNORMAL HIGH (ref 65–99)
POTASSIUM: 3.8 mmol/L (ref 3.5–5.1)
Sodium: 141 mmol/L (ref 135–145)
TOTAL PROTEIN: 7.4 g/dL (ref 6.5–8.1)

## 2015-07-05 LAB — URINE MICROSCOPIC-ADD ON

## 2015-07-05 LAB — CBC
HCT: 39.1 % (ref 36.0–46.0)
Hemoglobin: 13.1 g/dL (ref 12.0–15.0)
MCH: 28.9 pg (ref 26.0–34.0)
MCHC: 33.5 g/dL (ref 30.0–36.0)
MCV: 86.3 fL (ref 78.0–100.0)
PLATELETS: 267 10*3/uL (ref 150–400)
RBC: 4.53 MIL/uL (ref 3.87–5.11)
RDW: 13.8 % (ref 11.5–15.5)
WBC: 6.1 10*3/uL (ref 4.0–10.5)

## 2015-07-05 LAB — PREGNANCY, URINE: Preg Test, Ur: NEGATIVE

## 2015-07-05 MED ORDER — SODIUM CHLORIDE 0.9 % IV BOLUS (SEPSIS)
1000.0000 mL | Freq: Once | INTRAVENOUS | Status: AC
  Administered 2015-07-05: 1000 mL via INTRAVENOUS

## 2015-07-05 MED ORDER — MORPHINE SULFATE 4 MG/ML IJ SOLN
4.0000 mg | Freq: Once | INTRAMUSCULAR | Status: AC
  Administered 2015-07-05: 4 mg via INTRAVENOUS
  Filled 2015-07-05: qty 1

## 2015-07-05 MED ORDER — KETOROLAC TROMETHAMINE 30 MG/ML IJ SOLN
30.0000 mg | Freq: Once | INTRAMUSCULAR | Status: AC
  Administered 2015-07-05: 30 mg via INTRAVENOUS
  Filled 2015-07-05: qty 1

## 2015-07-05 MED ORDER — ONDANSETRON HCL 4 MG/2ML IJ SOLN
4.0000 mg | Freq: Once | INTRAMUSCULAR | Status: AC
  Administered 2015-07-05: 4 mg via INTRAVENOUS
  Filled 2015-07-05: qty 2

## 2015-07-05 NOTE — ED Provider Notes (Signed)
CSN: [id number]     Arrival date & time 07/05/15  1558 History   First MD Initiated Contact with Patient 07/05/15 1613     Chief Complaint  Patient presents with  . Flank Pain   Gabriella Martinez is a 34 y.o. female with a history of multiple kidney stones with previous lithotripsy, and previous stenting who presents to the ED complaining of left flank pain for the past 10 days which is worse today. The patient reports a history of multiple kidney stones and this pain feels the same. She complains of left flank pain at a 10/10 and sharp. She reports she had slept for 12 hours prior to arrival and has not had anything for pain today. The patient denies dysuria, decreased urination, hematuria, urinary frequency or urinary urgency. She denies having any stenting. She is followed by urologist Dr. Patsi Sears. She denies fevers, chills, abdominal pain, nausea, vomiting, lightheadedness, dizziness, or urinary symptoms.   (Consider location/radiation/quality/duration/timing/severity/associated sxs/prior Treatment) HPI  Past Medical History  Diagnosis Date  . Kidney stones    Past Surgical History  Procedure Laterality Date  . Lithotripsy    . Cystoscopy w/ ureteral stent placement  05/18/2012    Procedure: CYSTOSCOPY WITH RETROGRADE PYELOGRAM/URETERAL STENT PLACEMENT;  Surgeon: Valetta Fuller, MD;  Location: [facility];  Service: Urology;  Laterality: Left;  . Ureteroscopy  06/05/2012    Procedure: URETEROSCOPY;  Surgeon: Valetta Fuller, MD;  Location: [facility];  Service: Urology;  Laterality: Left;      . Cystoscopy w/ ureteral stent removal  06/05/2012    Procedure: CYSTOSCOPY WITH STENT REMOVAL;  Surgeon: Valetta Fuller, MD;  Location: [facility];  Service: Urology;  Laterality: Left;  . Cystoscopy w/ retrogrades Right 03/19/2015    Procedure: CYSTOSCOPY WITH RETROGRADE PYELOGRAM and double j stent placement;  Surgeon: Jethro Bolus, MD;  Location: [facility];  Service: Urology;  Laterality: Right;    History reviewed. No pertinent family history. History  Substance Use Topics  . Smoking status: Current Every Day Smoker  . Smokeless tobacco: Never Used  . Alcohol Use: Yes   OB History    No data available     Review of Systems  Constitutional: Negative for fever and chills.  HENT: Negative for congestion and sore throat.   Eyes: Negative for visual disturbance.  Respiratory: Negative for cough, shortness of breath and wheezing.   Cardiovascular: Negative for chest pain and palpitations.  Gastrointestinal: Negative for nausea, vomiting, abdominal pain and diarrhea.  Genitourinary: Positive for flank pain. Negative for dysuria, urgency, frequency, hematuria, vaginal bleeding and difficulty urinating.  Musculoskeletal: Negative for back pain and neck pain.  Skin: Negative for rash.  Neurological: Negative for light-headedness and headaches.      Allergies  Review of patient's allergies indicates no known allergies.  Home Medications   Prior to Admission medications   Medication Sig Start Date End Date Taking? Authorizing Provider  Aspirin-Caffeine (BAYER BACK & BODY PO) Take 2 tablets by mouth once.   Yes Historical Provider, MD  ibuprofen (ADVIL,MOTRIN) 200 MG tablet Take 800 mg by mouth every 6 (six) hours as needed for moderate pain.   Yes Historical Provider, MD  Multiple Vitamin (MULTIVITAMIN WITH MINERALS) TABS tablet Take 1 tablet by mouth daily.   Yes Historical Provider, MD  Norgestim-Eth Charlott Holler Triphasic (ORTHO TRI-CYCLEN LO PO) Take 1 tablet by mouth daily. Take every morning   Yes Historical Provider, MD  oxybutynin (DITROPAN) 5 MG tablet Take 1 tablet (5 mg  total) by mouth every 8 (eight) hours. Patient not taking: Reported on 07/05/2015 03/20/15 03/19/16  Barron Alvine, MD  oxyCODONE-acetaminophen (PERCOCET/ROXICET) 5-325 MG per tablet Take 1 tablet by mouth every 4 (four) hours as needed for moderate pain. Patient not taking: Reported on 07/05/2015 03/20/15    Barron Alvine, MD  phenazopyridine (PYRIDIUM) 200 MG tablet Take 1 tablet (200 mg total) by mouth 3 (three) times daily as needed for pain. Patient not taking: Reported on 07/05/2015 03/20/15   Barron Alvine, MD   BP 114/73 mmHg  Pulse 97  Temp(Src) 98.2 F (36.8 C) (Oral)  Resp 16  SpO2 99% Physical Exam  Constitutional: She is oriented to person, place, and time. She appears well-developed and well-nourished. No distress.  Nontoxic appearing. Patient is tearful and in obvious pain.   HENT:  Head: Normocephalic and atraumatic.  Mouth/Throat: Oropharynx is clear and moist. No oropharyngeal exudate.  Eyes: Conjunctivae are normal. Pupils are equal, round, and reactive to light. Right eye exhibits no discharge. Left eye exhibits no discharge.  Neck: Neck supple.  Cardiovascular: Normal rate, regular rhythm, normal heart sounds and intact distal pulses.  Exam reveals no gallop and no friction rub.   No murmur heard. Pulmonary/Chest: Effort normal and breath sounds normal. No respiratory distress. She has no wheezes. She has no rales.  Abdominal: Soft. Bowel sounds are normal. She exhibits no distension. There is no tenderness. There is no guarding.  Mild left flank tenderness. No right flank tenderness.  Abdomen is soft and non-tender to palpation. Negative psoas and obturator sign.   Musculoskeletal: She exhibits no edema or tenderness.  No lower extremity edema or tenderness. No midline back tenderness.   Lymphadenopathy:    She has no cervical adenopathy.  Neurological: She is alert and oriented to person, place, and time. Coordination normal.  Skin: Skin is warm and dry. No rash noted. She is not diaphoretic. No erythema. No pallor.  Psychiatric: She has a normal mood and affect. Her behavior is normal.  Nursing note and vitals reviewed.   ED Course  Procedures (including critical care time) Labs Review Labs Reviewed  COMPREHENSIVE METABOLIC PANEL - Abnormal; Notable for the  following:    Glucose, Bld 105 (*)    All other components within normal limits  URINALYSIS, ROUTINE W REFLEX MICROSCOPIC (NOT AT [facility]) - Abnormal; Notable for the following:    Ketones, ur 15 (*)    Leukocytes, UA SMALL (*)    All other components within normal limits  URINE MICROSCOPIC-ADD ON - Abnormal; Notable for the following:    Squamous Epithelial / LPF FEW (*)    Bacteria, UA FEW (*)    All other components within normal limits  URINE CULTURE  CBC  PREGNANCY, URINE    Imaging Review US Renal  07/05/2015   CLINICAL DATA:  Left flank pain 2 weeks.  EXAM: RENAL / URINARY TRACT ULTRASOUND COMPLETE  COMPARISON:  Ultrasound 03/18/2015 and CT 03/15/2015  FINDINGS: Right Kidney:  Length: 11.2 cm. Echogenicity within normal limits. Mild dilatation of the intrarenal collecting system with slight improvement compared to the previous study.  Left Kidney:  Length: 11.5 cm. Echogenicity within normal limits. No mass or hydronephrosis visualized.  Bladder:  Appears normal for degree of bladder distention. Bilateral ureteral jets visualized.  IMPRESSION: Minimal prominence of the right intrarenal collecting system with mild interval improvement compared to 03/18/2015.   Electronically Signed   By: Elberta Fortis M.D.   On: 07/05/2015 18:35  EKG Interpretation None      Filed Vitals:   07/05/15 1601 07/05/15 1816 07/05/15 1842  BP: 156/129 110/72 114/73  Pulse: 121 92 97  Temp: 98.3 F (36.8 C)  98.2 F (36.8 C)  TempSrc: Oral  Oral  Resp: 18 18 16   SpO2: 100% 100% 99%     MDM   Meds given in ED:  Medications  sodium chloride 0.9 % bolus 1,000 mL (0 mLs Intravenous Stopped 07/05/15 1809)  morphine 4 MG/ML injection 4 mg (4 mg Intravenous Given 07/05/15 1651)  ondansetron (ZOFRAN) injection 4 mg (4 mg Intravenous Given 07/05/15 1651)  ketorolac (TORADOL) 30 MG/ML injection 30 mg (30 mg Intravenous Given 07/05/15 1810)  morphine 4 MG/ML injection 4 mg (4 mg Intravenous Given 07/05/15  1905)    New Prescriptions   No medications on file    Final diagnoses:  Left flank pain   This  is a 34 y.o. female with a history of multiple kidney stones with previous lithotripsy, and previous stenting who presents to the ED complaining of left flank pain for the past 10 days which is worse today. The patient reports a history of multiple kidney stones and this pain feels the same. She complains of left flank pain at a 10/10 and sharp. She reports she had slept for 12 hours prior to arrival and has not had anything for pain today. The patient denies dysuria, decreased urination, hematuria, urinary frequency or urinary urgency. She denies having any stenting currently.  On exam the patient is afebrile and nontoxic appearing. Patient's abdomen is soft and nontender to palpation. Patient does have mild left-sided flank tenderness. The patient's urinalysis indicated no hemoglobin. He did have small leukocytes. She has a negative pregnancy test. Urine was sent for culture. CBC is within normal limits. CMP is unremarkable with normal creatinine of 0.96. Renal ultrasound indicated no hydronephrosis and bilateral ureteral jets are visualized. As she does not have AKI and bilateral ureteral jets are visualized this is not consistent with a large obstructing stone. The ultrasound did indicate minimal prominence of the right intrarenal collecting system with mild interval improvement compared to her previous study. This is on the opposite side of her pain.  I was headed to discuss lab findings and ultrasound findings when I was informed that the patient had left prior to discharge. I, nor any other EDP had advised her of her ultrasound findings. She was apparently upset that there was no stone, but I am not sure how she would know the test results prior to leaving as no one had informed her. She left almost immediately after receiving a repeat dose of morphine. Plan was for discharge, but patient left before I  spoke with her or before receiving her discharge paperwork.    Everlene Farrier, PA-C 07/05/15 1948  Laurence Spates, MD 07/05/15 2009

## 2015-07-05 NOTE — ED Notes (Signed)
Pt c/o left flank pain, states she has history of kidney stones.

## 2015-07-05 NOTE — ED Notes (Signed)
Pt very upset at this time and stated that she was leaving.  Pt verbalized, "I am not here just for pain medicine and the doctor told me I don't have kidney stones. I am leaving and I'm just going somewhere else".  Advised that her concerns will be forwarded to EDP at this time but pt demanded, "Just remove this IV and I'm leaving--- my husband is already out there waiting for me".  Pt left ED in a hurry as soon as PIV line was removed.

## 2015-07-05 NOTE — ED Notes (Signed)
Pt. Caught me in the hall telling me she is having much pain.  I notified Dr. Clarene Duke, who ordered pain med., which I gave.  She is tearful.

## 2015-07-07 LAB — URINE CULTURE

## 2015-12-01 ENCOUNTER — Encounter (HOSPITAL_COMMUNITY): Payer: Self-pay | Admitting: Emergency Medicine

## 2015-12-01 ENCOUNTER — Emergency Department (HOSPITAL_COMMUNITY)
Admission: EM | Admit: 2015-12-01 | Discharge: 2015-12-01 | Disposition: A | Discharge status: Home / Self Care | Payer: No Typology Code available for payment source | Attending: Emergency Medicine | Admitting: Emergency Medicine

## 2015-12-01 DIAGNOSIS — Z87442 Personal history of urinary calculi: Secondary | ICD-10-CM | POA: Insufficient documentation

## 2015-12-01 DIAGNOSIS — S60512A Abrasion of left hand, initial encounter: Secondary | ICD-10-CM | POA: Diagnosis not present

## 2015-12-01 DIAGNOSIS — S0993XA Unspecified injury of face, initial encounter: Secondary | ICD-10-CM | POA: Diagnosis not present

## 2015-12-01 DIAGNOSIS — F17200 Nicotine dependence, unspecified, uncomplicated: Secondary | ICD-10-CM | POA: Insufficient documentation

## 2015-12-01 DIAGNOSIS — S199XXA Unspecified injury of neck, initial encounter: Secondary | ICD-10-CM | POA: Insufficient documentation

## 2015-12-01 DIAGNOSIS — Y998 Other external cause status: Secondary | ICD-10-CM | POA: Insufficient documentation

## 2015-12-01 DIAGNOSIS — Y9389 Activity, other specified: Secondary | ICD-10-CM | POA: Diagnosis not present

## 2015-12-01 DIAGNOSIS — S7012XA Contusion of left thigh, initial encounter: Secondary | ICD-10-CM | POA: Insufficient documentation

## 2015-12-01 DIAGNOSIS — M542 Cervicalgia: Secondary | ICD-10-CM

## 2015-12-01 DIAGNOSIS — Y92410 Unspecified street and highway as the place of occurrence of the external cause: Secondary | ICD-10-CM | POA: Diagnosis not present

## 2015-12-01 DIAGNOSIS — S79922A Unspecified injury of left thigh, initial encounter: Secondary | ICD-10-CM | POA: Diagnosis present

## 2015-12-01 DIAGNOSIS — Z79899 Other long term (current) drug therapy: Secondary | ICD-10-CM | POA: Diagnosis not present

## 2015-12-01 MED ORDER — NAPROXEN 250 MG PO TABS: 250.0000 mg | ORAL_TABLET | Freq: Two times a day (BID) | ORAL | Status: AC

## 2015-12-01 MED ORDER — METHOCARBAMOL 500 MG PO TABS: 500.0000 mg | ORAL_TABLET | Freq: Two times a day (BID) | ORAL | Status: AC | PRN

## 2015-12-01 MED ORDER — IBUPROFEN 800 MG PO TABS
800.0000 mg | ORAL_TABLET | Freq: Once | ORAL | Status: AC
  Administered 2015-12-01: 800 mg via ORAL
  Filled 2015-12-01: qty 1
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
## 2015-12-08 ENCOUNTER — Encounter (HOSPITAL_COMMUNITY): Payer: Self-pay | Admitting: Emergency Medicine

## 2015-12-08 ENCOUNTER — Emergency Department (HOSPITAL_COMMUNITY)
Admission: EM | Admit: 2015-12-08 | Discharge: 2015-12-08 | Disposition: A | Discharge status: Home / Self Care | Payer: No Typology Code available for payment source | Attending: Emergency Medicine | Admitting: Emergency Medicine

## 2015-12-08 ENCOUNTER — Emergency Department (HOSPITAL_COMMUNITY): Payer: No Typology Code available for payment source

## 2015-12-08 DIAGNOSIS — M25511 Pain in right shoulder: Secondary | ICD-10-CM | POA: Diagnosis not present

## 2015-12-08 DIAGNOSIS — Z79899 Other long term (current) drug therapy: Secondary | ICD-10-CM | POA: Insufficient documentation

## 2015-12-08 DIAGNOSIS — Z87442 Personal history of urinary calculi: Secondary | ICD-10-CM | POA: Insufficient documentation

## 2015-12-08 DIAGNOSIS — M545 Low back pain: Secondary | ICD-10-CM | POA: Diagnosis not present

## 2015-12-08 DIAGNOSIS — S161XXD Strain of muscle, fascia and tendon at neck level, subsequent encounter: Secondary | ICD-10-CM | POA: Diagnosis not present

## 2015-12-08 DIAGNOSIS — F17200 Nicotine dependence, unspecified, uncomplicated: Secondary | ICD-10-CM | POA: Insufficient documentation

## 2015-12-08 DIAGNOSIS — M542 Cervicalgia: Secondary | ICD-10-CM | POA: Diagnosis present

## 2015-12-08 DIAGNOSIS — Z793 Long term (current) use of hormonal contraceptives: Secondary | ICD-10-CM | POA: Insufficient documentation
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
## 2015-12-08 NOTE — Discharge Instructions (Signed)
1. Medications: continue naproxen and robaxin as needed for pain, continue usual home medications 2. Treatment: rest, drink plenty of fluids 3. Follow Up: Please follow up with your primary doctor in 3 days for discussion of your diagnoses and further evaluation after today's visit; if you do not have a primary care doctor use the resource guide provided to find one; Please return to the ER for any new or worsening symptoms, any additional concerns.    Emergency Department Resource Guide 1) Find a Doctor and Pay Out of Pocket Although you won't have to find out who is covered by your insurance plan, it is a good idea to ask around and get recommendations. You will then need to call the office and see if the doctor you have chosen will accept you as a new patient and what types of options they offer for patients who are self-pay. Some doctors offer discounts or will set up payment plans for their patients who do not have insurance, but you will need to ask so you aren't surprised when you get to your appointment.  2) Contact Your Local Health Department Not all health departments have doctors that can see patients for sick visits, but many do, so it is worth a call to see if yours does. If you don't know where your local health department is, you can check in your phone book. The CDC also has a tool to help you locate your state's health department, and many state websites also have listings of all of their local health departments.  3) Find a Walk-in Clinic If your illness is not likely to be very severe or complicated, you may want to try a walk in clinic. These are popping up all over the country in pharmacies, drugstores, and shopping centers. They're usually staffed by nurse practitioners or physician assistants that have been trained to treat common illnesses and complaints. They're usually fairly quick and inexpensive. However, if you have serious medical issues or chronic medical problems, these  are probably not your best option.  No Primary Care Doctor: - Call Health Connect at  [phone] - they can help you locate a primary care doctor that  accepts your insurance, provides certain services, etc. - Physician Referral Service- [phone]  Chronic Pain Problems: Organization         Address  Phone   Notes  [facility]  [phone] Patients need to be referred by their primary care doctor.   Medication Assistance: Organization         Address  Phone   Notes  [facility] Medication [facility] [address] [place], [place] [address] [phone] --Must be a resident of [facility] [place] -- Must have NO insurance coverage whatsoever (no Medicaid/ Medicare, etc.) -- The pt. MUST have a primary care doctor that directs their care regularly and follows them in the community   MedAssist  [phone]   [employer]  [phone]) [phone]    Agencies that provide inexpensive medical care: Organization         Address  Phone   Notes  [facility]  [phone]   [facility]    [phone]   [facility] [address] [place], [place] [address] [phone]   [facility] [address], [place] [phone]   [facility]    [phone]) [phone]   [facility]    [phone])  [place]  [address], [place] Phone:  [phone], Fax:  [phone] Hours of Operation:  9 am - 6 pm, M-F.  Also accepts Medicaid/Medicare and self-pay.  [facility] for [place], [address], [place] Phone: [phone], Fax: [phone]. Hours of Operation:  8:30 am - 5:30 pm, M-F.  Also accepts Medicaid and self-pay.  [facility] [place] [address] [place] Phone: [phone]   [place], [place], [place] [phone], Ext. [phone] Mondays &  Thursdays: 7-9 AM.  First 15 patients are seen on a first come, first serve basis.    [place] Providers:  Organization         Address  Phone   Notes  [facility] [address], [place] [phone] Also accepts self-pay patients.  [facility] [id number] [place], [place]  [phone]   [place], [address], [place] [phone]   [facility] [address] [place] [phone]   Lucianne Lei [address], [place]   [phone] Only accepts [place] Access [place] patients after they have their name applied to their card.   Self-Pay (no insurance) in [facility]:  Organization         Address  Phone   Notes  Sickle Cell Patients, [facility] [place] [phone]   [facility] [place] [phone]   [facility] [place]  [place], [place], [place] [phone]   [facility]/Dr. Osei-Bonsu  [address], [place] or [address], [place] [phone] Phone number for both [place] and [place] locations is the same.  [facility] [address], [place] [phone]   [facility] [address], [place] or [address] [phone]   [facility] [address] [place] [phone], phone; [phone], fax Sees patients 1st and 3rd Saturday of every month.  Must not qualify for public or private insurance (i.e. Medicaid, Medicare, [place] Health Choice, Veterans' Benefits)  Household income should be no more than 200% of the poverty level The clinic cannot treat you if you are pregnant or think you are pregnant  Sexually transmitted diseases are not treated at the  clinic.    Dental Care: Organization         Address  Phone  Notes  [facility] Department of [facility] [phone] Accepts children up to age 41 who are enrolled in [place] or [place]; pregnant women with a Medicaid card; and children who have applied for Medicaid or [place] Health Choice, but were declined, whose parents can pay a reduced fee at time of service.  [facility] Department of [facility]  [address], [place] [phone] Accepts children up to age 15 who are enrolled in [place] or [place]; pregnant women with a Medicaid card; and children who have applied for Medicaid or [place] Health Choice, but were declined, whose parents can pay a reduced fee at time of service.  [place] Adult [facility] Access  PROGRAM  [place] [phone] Patients are seen by appointment only. Walk-ins are not accepted. [place] will see patients 63 years of age and older. Monday - Tuesday (8am-5pm) Most Wednesdays (8:30-5pm) $30 per visit, cash only  [facility] Adult Dental Access PROGRAM  [address], [facility] [phone] Patients are seen by appointment only. Walk-ins are not accepted. [place] will see patients 39 years of age and older. One Wednesday Evening (Monthly: Volunteer Based).  $30 per visit, cash only  [place]  [phone] for adults; Children under age 63, call Graduate Pediatric Dentistry at [phone]. Children aged 87-14, please call [phone] to request a pediatric application.  Dental services are provided in all areas of dental care including fillings, crowns and bridges, complete and partial dentures, implants, gum treatment, root canals, and extractions. Preventive care is also provided. Treatment is provided to both adults and children. Patients are selected via a lottery and there is often a  waiting list.   [facility] [address], [place]  [phone] [email or web address]   [facility] [address] [place], [place] [phone], Ext. [phone] Second and Fourth Thursday of each month, opens at 6:30 AM; Clinic ends at 9 AM.  Patients are seen on a first-come first-served basis, and a limited number are seen during each clinic.   [facility]  [address] Hillard Danker [place], [place] [phone]   Eligibility Requirements You must have lived in [place], [place], or [place] counties for at least the last three months.   You cannot be eligible for state or federal sponsored [employer], including [employer], [place], or [employer].   You generally cannot be eligible for healthcare insurance through your employer.    How to apply: Eligibility screenings are held every Tuesday and Wednesday afternoon from 1:00 pm until 4:00 pm. You do not need an appointment for the interview!  [facility] [address] [place], [place]   [place]  [place] Department  [place]  [phone]    Behavioral Health Resources in the Community: Intensive Outpatient Programs Organization         Address  Phone  Notes  [place]. [address] [place], [place] [phone]   [facility] Outpatient [address] [place], [place]   ADS: [facility] [address] [place], [place]   [place] [address]  [place], [place] or [phone]   Substance Abuse Resources Organization         Address  Phone  Notes  Alcohol and Drug Services  [phone]   [place]  [phone]   [facility] [place]   Chinita Pester  [phone]   Residential & Outpatient Substance Abuse  Program  2106011332   Psychological Services Organization         Address  Phone  Notes  [facility] [place]  [place]  [phone]   [place] [address], [place] or [phone]    Mobile Crisis Teams Organization         Address  Phone  Notes  [facility], Mobile Crisis Care Unit  [phone]   [facility]  [address] [place], [place]  [phone]   [facility] [address] [place] [phone]    Self-Help/Support Groups Organization         Address  Phone             Notes  [facility] [place] - variety of support groups  336- I7437963 Call for more information  Narcotics Anonymous (NA), [facility] [address], [employer] [place]  2 meetings at this location   Statistician         Address  Phone  Notes  [facility] [address],    [place]  [phone]   [facility]  [address], [place] [address], [place], [place] [phone]   [facility] Treatment Facility [address] [place], [place] [phone] Admissions: 8am-3pm M-F  [facility] [address],    [place], [place] [phone]   [facility] [address] [place], [place], [place] [phone]   [facility] [address]  [place], [place] [phone]   [facility] - Intensive Outpatient [address], [place], [place] [phone]   [facility] (Addiction Recovery [facility] [address]  [place], [place] [phone] or [phone])[phone]   [facility] Services (RTS) [address], [place], [place] [phone] Accepts Medicaid  [facility] [place] [address],  [place] [phone] Substance Abuse/Addiction Treatment   [facility] Organization          Address  Phone  Notes  [facility]  [phone]   Angie Fava, PhD [address]vin Knack [place], [place]   ([phone] or [phone]   [facility] Behavioral   [address] [place], [place] [phone]   [facility] [address], [place], [place] [phone] Insurance/Medicaid/sponsorship through [facility] and Families [address]                                    [place], [place] [phone] Therapy/tele-psych/case  [facility] [address], [place] [phone]    Dr. Lolly Mustache  [phone]   [facility]  [employer] [facility] Dept. 1) [address] [place] 2) [address], [place] 3)  [address], [place] [phone]  [phone]   [facility] Child Abuse Hotline 4751511769 or [phone] (After Hours)
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
## 2022-07-23 NOTE — Progress Notes (Addendum)
 Subjective:  Patient ID: Gabriella Martinez is a 41 y.o. female.  Chief Complaint  Patient presents with  . Skin complaint      HPI  Skin complaint  Patient presents with: rash   Chronicity:  New Onset:  1 week History of trauma: No   Mechanism of injury: other   Burn mechanism:  Unknown Incident location:  At home Laterality:  On the left Location/Radiation:  Back Pain severity:  Moderate Pain quality:  Aching Frequency:  Constant Timing since onset:  Gradually worsening Exposure to comment:  None Characteristics:  Pain, redness, blistering, rash, tenderness and soreness Aggravated by:  Activity Treatments tried:  Rest Response to treatment:  No relief Associated symptoms: lesion and rash   Associated symptoms: no bleeding, no chest pain, no difficulty swallowing, no dizziness, no eschar, no eye inflammation, no facial swelling, no fever, no headache, no itching, no limited range of motion, no lock jaw, no nail changes, no neck pain, no pimple, no shortness of breath, no stiff neck, no tingling, no vomiting, no weakness, no purulent discharge and no loss of sensation   Tetanus status:  Unknown Zika exposure risk:  Unknown        Review of Systems  Constitutional: Negative for fever.  HENT: Negative for facial swelling and trouble swallowing.   Respiratory: Negative for shortness of breath.   Cardiovascular: Negative for chest pain.  Gastrointestinal: Negative for vomiting.  Musculoskeletal: Negative for neck pain and neck stiffness.  Skin: Positive for rash. Negative for itching and nail changes.  Neurological: Negative for dizziness and weakness.    Social History   Tobacco Use  Smoking Status Every Day  . Packs/day: 0.50  . Years: 20.00  . Pack years: 10.00  . Types: Cigarettes  Smokeless Tobacco Never  Tobacco Comments   Education provided    Past Medical History:  Diagnosis Date  . CKD (chronic kidney disease)    History reviewed. No pertinent  surgical history. History reviewed. No pertinent family history. Objective:      Physical Exam Constitutional:      General: She is not in acute distress.    Appearance: Normal appearance. She is normal weight. She is not ill-appearing, toxic-appearing or diaphoretic.  HENT:     Head: Normocephalic and atraumatic.  Neck:     Vascular: No carotid bruit.  Cardiovascular:     Rate and Rhythm: Normal rate and regular rhythm.     Pulses: Normal pulses.     Heart sounds: Normal heart sounds. No murmur heard.   No friction rub. No gallop.  Pulmonary:     Effort: Pulmonary effort is normal. No respiratory distress.     Breath sounds: Normal breath sounds. No stridor. No wheezing, rhonchi or rales.  Chest:     Chest wall: No tenderness.  Abdominal:     General: Abdomen is flat. Bowel sounds are normal. There is no distension.     Palpations: Abdomen is soft. There is no mass.     Tenderness: There is no abdominal tenderness. There is no right CVA tenderness, left CVA tenderness, guarding or rebound.     Hernia: No hernia is present.  Musculoskeletal:        General: Normal range of motion.     Cervical back: Normal range of motion. No rigidity or tenderness.  Lymphadenopathy:     Cervical: No cervical adenopathy.  Skin:    General: Skin is warm and dry.     Capillary Refill: Capillary refill  takes 2 to 3 seconds.     Coloration: Skin is not ashen, cyanotic, jaundiced, mottled, pale or sallow.     Findings: Lesion and rash present. No abrasion, abscess, acne, bruising, burn, ecchymosis, erythema, signs of injury, laceration, petechiae or wound. Rash is vesicular. Rash is not crusting, macular, nodular, papular, purpuric, pustular, scaling or urticarial.     Nails: There is no clubbing.       Neurological:     General: No focal deficit present.     Mental Status: She is alert and oriented to person, place, and time.  Psychiatric:        Mood and Affect: Mood normal.         Behavior: Behavior normal.        Thought Content: Thought content normal.        Judgment: Judgment normal.       Assessment/Plan:  HPI provided by Self  Based on today's visit:history and physical exam only, as no relevant testing deemed necessary patient's visit diagnosis is/includes  1. Herpesviral vesicular dermatitis    Patient does not have a history of chronic conditions.  Treatment plan includes:  Orders Placed: No orders of the defined types were placed in this encounter.  Medications ordered this visit   Signed Prescriptions Disp Refills  . valACYclovir (Valtrex) 1000 MG tablet 21 tablet 0    Sig: Take 1 tablet (1,000 mg total) by mouth 3 (three) times a day for 7 days  . naproxen  sodium (ANAPROX ) 275 MG tablet 14 tablet 0    Sig: Take 1 tablet (275 mg total) by mouth every 12 (twelve) hours for 7 days Maximum dosage 1100mg /24 hours    Current medication list and any new medications prescribed or recommended today were reviewed with the patient and specific instructions were provided Yes  Provider Recommendations   -Education provided  - Drink plenty of fluids.  -Reassurance likely viral not requiring antibiotic at this time.  -Take medications as directed  -Supportive care. Increase fluids -Tylenol  or Motrin  prn for fever and pain -Patient should seek immediate medical attention with worsening symptoms 48-72 hours or high fever develops    Follow up care instructions were provided and reviewed?with the  Patient. All questions were answered. Patient verbalized understanding of plan of care today.

## 2024-10-05 ENCOUNTER — Emergency Department (HOSPITAL_BASED_OUTPATIENT_CLINIC_OR_DEPARTMENT_OTHER): Payer: Self-pay

## 2024-10-05 ENCOUNTER — Encounter (HOSPITAL_BASED_OUTPATIENT_CLINIC_OR_DEPARTMENT_OTHER): Payer: Self-pay

## 2024-10-05 ENCOUNTER — Emergency Department (HOSPITAL_BASED_OUTPATIENT_CLINIC_OR_DEPARTMENT_OTHER)
Admission: EM | Admit: 2024-10-05 | Discharge: 2024-10-05 | Disposition: A | Discharge status: Home / Self Care | Payer: Self-pay | Attending: Emergency Medicine | Admitting: Emergency Medicine

## 2024-10-05 ENCOUNTER — Other Ambulatory Visit: Payer: Self-pay

## 2024-10-05 DIAGNOSIS — R410 Disorientation, unspecified: Secondary | ICD-10-CM | POA: Insufficient documentation

## 2024-10-05 DIAGNOSIS — R000 Tachycardia, unspecified: Secondary | ICD-10-CM | POA: Insufficient documentation

## 2024-10-05 DIAGNOSIS — F1910 Other psychoactive substance abuse, uncomplicated: Secondary | ICD-10-CM | POA: Insufficient documentation

## 2024-10-05 LAB — URINALYSIS, MICROSCOPIC (REFLEX)
Squamous Epithelial / HPF: NONE SEEN /HPF (ref 0–5)
WBC, UA: NONE SEEN WBC/hpf (ref 0–5)

## 2024-10-05 LAB — COMPREHENSIVE METABOLIC PANEL WITH GFR
ALT: 18 U/L (ref 0–44)
AST: 33 U/L (ref 15–41)
Albumin: 4.6 g/dL (ref 3.5–5.0)
Alkaline Phosphatase: 71 U/L (ref 38–126)
Anion gap: 12 (ref 5–15)
BUN: 14 mg/dL (ref 6–20)
CO2: 27 mmol/L (ref 22–32)
Calcium: 9.3 mg/dL (ref 8.9–10.3)
Chloride: 104 mmol/L (ref 98–111)
Creatinine, Ser: 0.92 mg/dL (ref 0.44–1.00)
GFR, Estimated: >60 mL/min (ref >60)
Glucose, Bld: 130 mg/dL — ABNORMAL HIGH (ref 70–99)
Potassium: 3.9 mmol/L (ref 3.5–5.1)
Sodium: 142 mmol/L (ref 135–145)
Total Bilirubin: <0.2 mg/dL (ref 0.0–1.2)
Total Protein: 7.3 g/dL (ref 6.5–8.1)

## 2024-10-05 LAB — CBC WITH DIFFERENTIAL/PLATELET
Abs Immature Granulocytes: 0.03 K/uL (ref 0.00–0.07)
Basophils Absolute: 0.1 K/uL (ref 0.0–0.1)
Basophils Relative: 1 %
Eosinophils Absolute: 0.4 K/uL (ref 0.0–0.5)
Eosinophils Relative: 4 %
HCT: 31.8 % — ABNORMAL LOW (ref 36.0–46.0)
Hemoglobin: 9.8 g/dL — ABNORMAL LOW (ref 12.0–15.0)
Immature Granulocytes: 0 %
Lymphocytes Relative: 18 %
Lymphs Abs: 1.7 K/uL (ref 0.7–4.0)
MCH: 25.7 pg — ABNORMAL LOW (ref 26.0–34.0)
MCHC: 30.8 g/dL (ref 30.0–36.0)
MCV: 83.2 fL (ref 80.0–100.0)
Monocytes Absolute: 0.6 K/uL (ref 0.1–1.0)
Monocytes Relative: 7 %
Neutro Abs: 6.3 K/uL (ref 1.7–7.7)
Neutrophils Relative %: 70 %
Platelets: 288 K/uL (ref 150–400)
RBC: 3.82 MIL/uL — ABNORMAL LOW (ref 3.87–5.11)
RDW: 15 % (ref 11.5–15.5)
WBC: 9.1 K/uL (ref 4.0–10.5)
nRBC: 0 % (ref 0.0–0.2)

## 2024-10-05 LAB — URINALYSIS, ROUTINE W REFLEX MICROSCOPIC
Bilirubin Urine: NEGATIVE
Glucose, UA: NEGATIVE mg/dL
Ketones, ur: NEGATIVE mg/dL
Leukocytes,Ua: NEGATIVE
Nitrite: NEGATIVE
Protein, ur: 100 mg/dL — AB
Specific Gravity, Urine: >=1.03 (ref 1.005–1.030)
pH: 6 (ref 5.0–8.0)

## 2024-10-05 LAB — URINE DRUG SCREEN
Amphetamines: NEGATIVE
Barbiturates: NEGATIVE
Benzodiazepines: NEGATIVE
Cocaine: POSITIVE — AB
Fentanyl: POSITIVE — AB
Methadone Scn, Ur: NEGATIVE
Opiates: POSITIVE — AB
Tetrahydrocannabinol: NEGATIVE

## 2024-10-05 LAB — SALICYLATE LEVEL: Salicylate Lvl: <7 mg/dL — ABNORMAL LOW (ref 7.0–30.0)

## 2024-10-05 LAB — ACETAMINOPHEN LEVEL: Acetaminophen (Tylenol), Serum: <10 ug/mL — ABNORMAL LOW (ref 10–30)

## 2024-10-05 LAB — ETHANOL: Alcohol, Ethyl (B): <15 mg/dL (ref <15)

## 2024-10-05 LAB — PREGNANCY, URINE: Preg Test, Ur: NEGATIVE

## 2024-10-05 MED ORDER — SODIUM CHLORIDE 0.9 % IV BOLUS
500.0000 mL | Freq: Once | INTRAVENOUS | Status: AC
  Administered 2024-10-05: 500 mL via INTRAVENOUS

## 2024-10-05 NOTE — ED Notes (Signed)
 Pt in BR when pt called to triage

## 2024-10-05 NOTE — Discharge Instructions (Signed)
 As we discussed, your tests are all reassuring that there is no infection, or injury that is causing the change in your mental status. Your drug screen is positive for several substances, which is likely the source of your symptoms.   You can be discharge home. Return to the ED with any new or worsening symptoms at any time.

## 2024-10-05 NOTE — ED Notes (Signed)
Urine specimen in lab 

## 2024-10-05 NOTE — ED Notes (Signed)
 Patient transported to CT

## 2024-10-05 NOTE — ED Triage Notes (Signed)
 Pt to ED from home with c/o AMS. Per pt her boss said she was acting odd and confused at work and he brought her here to get checked out. Pt denies any alcohol or drug use and is a very poor historian of events. Pt is extremely anxious almost somewhat agitated in triage. Denies any pain, vita signs are elevated.

## 2024-10-05 NOTE — ED Provider Notes (Signed)
 [place] [facility] Provider Note   CSN: [id number] Arrival date & time: 10/05/24  8151     Patient presents with: Altered Mental Status   Gabriella Martinez is a 43 y.o. female.   Patient to ED with boss/coworker who reported to triage she was acting strangely at work and he became concerned. The patient denies that she feels she is acting differently than usual. She denies alcohol or drug use. No recent illness or fever. She denies head injury, headache, vomiting. No pain.   The history is provided by the patient. No language interpreter was used.  Altered Mental Status      Prior to Admission medications   Medication Sig Start Date End Date Taking? Authorizing Provider  ibuprofen  (ADVIL ,MOTRIN ) 200 MG tablet Take 800 mg by mouth every 6 (six) hours as needed for moderate pain.    [provider]  methocarbamol  (ROBAXIN ) 500 MG tablet Take 1 tablet (500 mg total) by mouth 2 (two) times daily as needed for muscle spasms. 12/01/15   Dansie, William, PA-C  Multiple Vitamin (MULTIVITAMIN WITH MINERALS) TABS tablet Take 1 tablet by mouth daily.    [provider]  naproxen  (NAPROSYN ) 250 MG tablet Take 1 tablet (250 mg total) by mouth 2 (two) times daily with a meal. Patient not taking: Reported on 12/08/2015 12/01/15   Dansie, William, PA-C  norethindrone (MICRONOR,CAMILA,ERRIN) 0.35 MG tablet Take 1 tablet by mouth daily.    [provider]  oxyCODONE -acetaminophen  (PERCOCET/ROXICET) 5-325 MG per tablet Take 1 tablet by mouth every 4 (four) hours as needed for moderate pain. Patient not taking: Reported on 07/05/2015 03/20/15   Alline Lenis, MD  phenazopyridine  (PYRIDIUM ) 200 MG tablet Take 1 tablet (200 mg total) by mouth 3 (three) times daily as needed for pain. Patient not taking: Reported on 07/05/2015 03/20/15   Alline Lenis, MD    Allergies: Patient has no known allergies.    Review of Systems  Updated Vital  Signs BP (!) 159/106   Pulse (!) 106   Temp 98.8 F (37.1 C) (Oral)   Resp 18   Ht 5' 3 (1.6 m)   Wt 47.6 kg   LMP 09/14/2024 (Approximate)   SpO2 98%   BMI 18.60 kg/m   Physical Exam Vitals and nursing note reviewed.  Constitutional:      Comments: Patient is restless, does not still quietly or still. Cooperative.   Eyes:     Extraocular Movements: Extraocular movements intact.     Conjunctiva/sclera: Conjunctivae normal.     Pupils: Pupils are equal, round, and reactive to light.  Cardiovascular:     Rate and Rhythm: Regular rhythm. Tachycardia present.     Heart sounds: No murmur heard. Pulmonary:     Effort: Pulmonary effort is normal.     Breath sounds: Normal breath sounds. No wheezing, rhonchi or rales.  Abdominal:     Palpations: Abdomen is soft.     Tenderness: There is no abdominal tenderness.  Musculoskeletal:        General: Normal range of motion.  Skin:    General: Skin is warm and dry.  Neurological:     Mental Status: She is alert and oriented to person, place, and time.  Psychiatric:        Attention and Perception: She does not perceive auditory or visual hallucinations.        Mood and Affect: Mood is anxious.        Speech: Speech is  rapid and pressured.        Behavior: Behavior is hyperactive. Behavior is cooperative.        Thought Content: Thought content is not paranoid or delusional.     (all labs ordered are listed, but only abnormal results are displayed) Labs Reviewed  CBC WITH DIFFERENTIAL/PLATELET - Abnormal; Notable for the following components:      Result Value   RBC 3.82 (*)    Hemoglobin 9.8 (*)    HCT 31.8 (*)    MCH 25.7 (*)    All other components within normal limits  SALICYLATE LEVEL - Abnormal; Notable for the following components:   Salicylate Lvl <7.0 (*)    All other components within normal limits  COMPREHENSIVE METABOLIC PANEL WITH GFR - Abnormal; Notable for the following components:   Glucose, Bld 130 (*)     All other components within normal limits  ACETAMINOPHEN  LEVEL - Abnormal; Notable for the following components:   Acetaminophen  (Tylenol ), Serum <10 (*)    All other components within normal limits  URINE DRUG SCREEN - Abnormal; Notable for the following components:   Opiates POSITIVE (*)    Cocaine POSITIVE (*)    Fentanyl  POSITIVE (*)    All other components within normal limits  URINALYSIS, ROUTINE W REFLEX MICROSCOPIC - Abnormal; Notable for the following components:   Hgb urine dipstick MODERATE (*)    Protein, ur 100 (*)    All other components within normal limits  URINALYSIS, MICROSCOPIC (REFLEX) - Abnormal; Notable for the following components:   Bacteria, UA RARE (*)    All other components within normal limits  ETHANOL  PREGNANCY, URINE    EKG: EKG Interpretation Date/Time:  Friday October 05 2024 19:44:39 EDT Ventricular Rate:  111 PR Interval:  147 QRS Duration:  95 QT Interval:  318 QTC Calculation: 433 R Axis:   75  Text Interpretation: Sinus tachycardia Confirmed by Yolande Charleston [phone]) on 10/05/2024 7:54:30 PM  Radiology: CT Head Wo Contrast Result Date: 10/05/2024 EXAM: CT HEAD WITHOUT CONTRAST 10/05/2024 08:15:00 PM TECHNIQUE: CT of the head was performed without the administration of intravenous contrast. Automated exposure control, iterative reconstruction, and/or weight based adjustment of the mA/kV was utilized to reduce the radiation dose to as low as reasonably achievable. COMPARISON: None available. CLINICAL HISTORY: Mental status change, unknown cause. FINDINGS: BRAIN AND VENTRICLES: No acute hemorrhage. No evidence of acute infarct. No hydrocephalus. No extra-axial collection. No mass effect or midline shift. ORBITS: No acute abnormality. SINUSES: Large defect.  noted in the anterior nasal septum. SOFT TISSUES AND SKULL: No acute soft tissue abnormality. No skull fracture. IMPRESSION: 1. No acute intracranial abnormality. 2. Large anterior nasal  septal defect. Electronically signed by: Franky Crease MD 10/05/2024 08:24 PM EDT RP Workstation: [id number]     Procedures   Medications Ordered in the ED  sodium chloride  0.9 % bolus 500 mL (0 mLs Intravenous Stopped 10/05/24 2201)    Clinical Course as of 10/05/24 2230  Fri Oct 05, 2024  2145 BIB friend/coworker concerned for AMS. Patient is restless, hyperactive but cooperative and oriented. Initially quite tachycardic, better with fluids. No hypoxia or respiratory distress. She is observed over time and continues to be cooperative, awake, oriented. VS improved.  [SU]  2226 Head CT negative. Labs are reassuring for normal renal function, normal electrolytes. She is anemic to 9.8 without recent comparable studies. Is hemodynamically stable. UDS positive for multiple substances including opiates, fentanyl  and cocaine. This is felt to be the  cause of the patient's presentation in the absence of infection, injury, bleed.   The patient's coworker has been contacted by the patient and will come to pick her up. She is not driving. She has been drinking in the room. No vomiting. She is considered stable to go home with friend.  [SU]    Clinical Course User Index [SU] Odell Balls, PA-C                                 Medical Decision Making Amount and/or Complexity of Data Reviewed Labs: ordered. Radiology: ordered.        Final diagnoses:  Disorientation  Polysubstance abuse [facility])    ED Discharge Orders     None          Odell Balls RIGGERS 10/05/24 2230    Yolande Lamar BROCKS, MD 10/06/24 [phone]
